# Patient Record
Sex: Male | Born: 1960 | Hispanic: No | Marital: Married | Smoking: Never smoker
Health system: Southern US, Community
[De-identification: ages and names within clinical notes are randomized; demographics above are authoritative.]

## PROBLEM LIST (undated history)

## (undated) DIAGNOSIS — Z87442 Personal history of urinary calculi: Secondary | ICD-10-CM

## (undated) HISTORY — PX: APPENDECTOMY: SHX54

---

## 2017-07-30 ENCOUNTER — Inpatient Hospital Stay
Admission: EM | Admit: 2017-07-30 | Discharge: 2017-08-01 | DRG: 494 | Disposition: A | Discharge status: Home Health | Payer: Worker's Compensation | Attending: Surgery | Admitting: Surgery

## 2017-07-30 ENCOUNTER — Inpatient Hospital Stay: Payer: Worker's Compensation

## 2017-07-30 ENCOUNTER — Inpatient Hospital Stay: Payer: Worker's Compensation | Admitting: Certified Registered Nurse Anesthetist

## 2017-07-30 ENCOUNTER — Other Ambulatory Visit: Payer: Self-pay

## 2017-07-30 ENCOUNTER — Encounter
Admission: EM | Disposition: A | Discharge status: Home Health | Payer: Self-pay | Source: Home / Self Care | Attending: Surgery

## 2017-07-30 ENCOUNTER — Emergency Department: Payer: Worker's Compensation

## 2017-07-30 ENCOUNTER — Encounter: Payer: Self-pay | Admitting: Emergency Medicine

## 2017-07-30 DIAGNOSIS — R509 Fever, unspecified: Secondary | ICD-10-CM | POA: Diagnosis present

## 2017-07-30 DIAGNOSIS — Y9289 Other specified places as the place of occurrence of the external cause: Secondary | ICD-10-CM

## 2017-07-30 DIAGNOSIS — Y990 Civilian activity done for income or pay: Secondary | ICD-10-CM

## 2017-07-30 DIAGNOSIS — W208XXA Other cause of strike by thrown, projected or falling object, initial encounter: Secondary | ICD-10-CM | POA: Diagnosis present

## 2017-07-30 DIAGNOSIS — S8251XA Displaced fracture of medial malleolus of right tibia, initial encounter for closed fracture: Secondary | ICD-10-CM | POA: Diagnosis present

## 2017-07-30 DIAGNOSIS — S82402A Unspecified fracture of shaft of left fibula, initial encounter for closed fracture: Secondary | ICD-10-CM | POA: Diagnosis present

## 2017-07-30 DIAGNOSIS — M545 Low back pain: Secondary | ICD-10-CM | POA: Diagnosis present

## 2017-07-30 DIAGNOSIS — S82202A Unspecified fracture of shaft of left tibia, initial encounter for closed fracture: Secondary | ICD-10-CM | POA: Diagnosis present

## 2017-07-30 DIAGNOSIS — M79662 Pain in left lower leg: Secondary | ICD-10-CM | POA: Diagnosis present

## 2017-07-30 DIAGNOSIS — Z419 Encounter for procedure for purposes other than remedying health state, unspecified: Secondary | ICD-10-CM

## 2017-07-30 HISTORY — PX: ORIF TIBIA FRACTURE: SHX5416

## 2017-07-30 LAB — COMPREHENSIVE METABOLIC PANEL
ALT: 16 U/L — ABNORMAL LOW (ref 17–63)
ANION GAP: 7 (ref 5–15)
AST: 22 U/L (ref 15–41)
Albumin: 4.2 g/dL (ref 3.5–5.0)
Alkaline Phosphatase: 51 U/L (ref 38–126)
BILIRUBIN TOTAL: 0.8 mg/dL (ref 0.3–1.2)
BUN: 15 mg/dL (ref 6–20)
CHLORIDE: 103 mmol/L (ref 101–111)
CO2: 26 mmol/L (ref 22–32)
Calcium: 8.6 mg/dL — ABNORMAL LOW (ref 8.9–10.3)
Creatinine, Ser: 0.75 mg/dL (ref 0.61–1.24)
Glucose, Bld: 122 mg/dL — ABNORMAL HIGH (ref 65–99)
POTASSIUM: 3.7 mmol/L (ref 3.5–5.1)
Sodium: 136 mmol/L (ref 135–145)
TOTAL PROTEIN: 7.2 g/dL (ref 6.5–8.1)

## 2017-07-30 LAB — CBC WITH DIFFERENTIAL/PLATELET
BASOS ABS: 0.1 10*3/uL (ref 0–0.1)
Basophils Relative: 1 %
EOS PCT: 1 %
Eosinophils Absolute: 0.1 10*3/uL (ref 0–0.7)
HCT: 49.1 % (ref 40.0–52.0)
Hemoglobin: 16.8 g/dL (ref 13.0–18.0)
LYMPHS ABS: 1.1 10*3/uL (ref 1.0–3.6)
LYMPHS PCT: 11 %
MCH: 30.8 pg (ref 26.0–34.0)
MCHC: 34.3 g/dL (ref 32.0–36.0)
MCV: 89.9 fL (ref 80.0–100.0)
Monocytes Absolute: 0.4 10*3/uL (ref 0.2–1.0)
Monocytes Relative: 4 %
Neutro Abs: 8.3 10*3/uL — ABNORMAL HIGH (ref 1.4–6.5)
Neutrophils Relative %: 83 %
PLATELETS: 243 10*3/uL (ref 150–440)
RBC: 5.46 MIL/uL (ref 4.40–5.90)
RDW: 13.1 % (ref 11.5–14.5)
WBC: 10 10*3/uL (ref 3.8–10.6)

## 2017-07-30 SURGERY — OPEN REDUCTION INTERNAL FIXATION (ORIF) TIBIA FRACTURE
Anesthesia: Spinal | Site: Leg Lower | Laterality: Bilateral | Wound class: Clean

## 2017-07-30 MED ORDER — BUPIVACAINE IN DEXTROSE 0.75-8.25 % IT SOLN
INTRATHECAL | Status: DC | PRN
  Administered 2017-07-30: 1.8 mL via INTRATHECAL

## 2017-07-30 MED ORDER — FENTANYL CITRATE (PF) 100 MCG/2ML IJ SOLN: 25.0000 ug | INTRAMUSCULAR | Status: DC | PRN

## 2017-07-30 MED ORDER — OXYCODONE HCL 5 MG PO TABS: 5.0000 mg | ORAL_TABLET | ORAL | Status: DC | PRN

## 2017-07-30 MED ORDER — MIDAZOLAM HCL 5 MG/5ML IJ SOLN
INTRAMUSCULAR | Status: DC | PRN
  Administered 2017-07-30: 2 mg via INTRAVENOUS

## 2017-07-30 MED ORDER — MIDAZOLAM HCL 2 MG/2ML IJ SOLN
INTRAMUSCULAR | Status: AC
  Filled 2017-07-30: qty 2

## 2017-07-30 MED ORDER — LACTATED RINGERS IV SOLN
INTRAVENOUS | Status: DC | PRN
  Administered 2017-07-30: 19:00:00 via INTRAVENOUS

## 2017-07-30 MED ORDER — CLINDAMYCIN PHOSPHATE 600 MG/50ML IV SOLN
600.0000 mg | Freq: Four times a day (QID) | INTRAVENOUS | Status: AC
  Administered 2017-07-31 (×3): 600 mg via INTRAVENOUS
  Filled 2017-07-30 (×3): qty 50

## 2017-07-30 MED ORDER — KCL IN DEXTROSE-NACL 20-5-0.9 MEQ/L-%-% IV SOLN: INTRAVENOUS | Status: DC

## 2017-07-30 MED ORDER — FLEET ENEMA 7-19 GM/118ML RE ENEM: 1.0000 | ENEMA | Freq: Once | RECTAL | Status: DC | PRN

## 2017-07-30 MED ORDER — HYDROMORPHONE HCL 1 MG/ML IJ SOLN
1.0000 mg | Freq: Once | INTRAMUSCULAR | Status: AC
  Administered 2017-07-30: 1 mg via INTRAVENOUS
  Filled 2017-07-30: qty 1

## 2017-07-30 MED ORDER — ACETAMINOPHEN 325 MG PO TABS
650.0000 mg | ORAL_TABLET | ORAL | Status: DC | PRN
  Administered 2017-08-01: 650 mg via ORAL
  Filled 2017-07-30: qty 2

## 2017-07-30 MED ORDER — HYDROMORPHONE HCL 1 MG/ML IJ SOLN: 1.0000 mg | INTRAMUSCULAR | Status: DC | PRN

## 2017-07-30 MED ORDER — BUPIVACAINE HCL 0.5 % IJ SOLN
INTRAMUSCULAR | Status: DC | PRN
  Administered 2017-07-30: 20 mL
  Administered 2017-07-30: 10 mL

## 2017-07-30 MED ORDER — KETOROLAC TROMETHAMINE 15 MG/ML IJ SOLN: 30.0000 mg | Freq: Once | INTRAMUSCULAR | Status: DC

## 2017-07-30 MED ORDER — METOCLOPRAMIDE HCL 10 MG PO TABS: 5.0000 mg | ORAL_TABLET | Freq: Three times a day (TID) | ORAL | Status: DC | PRN

## 2017-07-30 MED ORDER — CLINDAMYCIN PHOSPHATE 600 MG/50ML IV SOLN
600.0000 mg | Freq: Once | INTRAVENOUS | Status: DC
  Filled 2017-07-30 (×2): qty 50

## 2017-07-30 MED ORDER — CLINDAMYCIN PHOSPHATE 600 MG/50ML IV SOLN
INTRAVENOUS | Status: DC | PRN
  Administered 2017-07-30: 600 mg via INTRAVENOUS

## 2017-07-30 MED ORDER — HYDROMORPHONE HCL 1 MG/ML IJ SOLN
1.0000 mg | INTRAMUSCULAR | Status: DC | PRN
Start: 2017-07-30 — End: 2017-07-30

## 2017-07-30 MED ORDER — KETOROLAC TROMETHAMINE 30 MG/ML IJ SOLN
INTRAMUSCULAR | Status: AC
  Filled 2017-07-30: qty 1

## 2017-07-30 MED ORDER — PANTOPRAZOLE SODIUM 40 MG IV SOLR: 40.0000 mg | Freq: Every day | INTRAVENOUS | Status: DC

## 2017-07-30 MED ORDER — ACETAMINOPHEN 500 MG PO TABS
1000.0000 mg | ORAL_TABLET | Freq: Four times a day (QID) | ORAL | Status: AC
  Administered 2017-07-31 (×4): 1000 mg via ORAL
  Filled 2017-07-30 (×4): qty 2

## 2017-07-30 MED ORDER — BISACODYL 10 MG RE SUPP: 10.0000 mg | Freq: Every day | RECTAL | Status: DC | PRN

## 2017-07-30 MED ORDER — PROPOFOL 500 MG/50ML IV EMUL
INTRAVENOUS | Status: AC
  Filled 2017-07-30: qty 50

## 2017-07-30 MED ORDER — FENTANYL CITRATE (PF) 100 MCG/2ML IJ SOLN
INTRAMUSCULAR | Status: DC | PRN
  Administered 2017-07-30 (×2): 50 ug via INTRAVENOUS

## 2017-07-30 MED ORDER — ACETAMINOPHEN 325 MG PO TABS: 650.0000 mg | ORAL_TABLET | Freq: Four times a day (QID) | ORAL | Status: DC | PRN

## 2017-07-30 MED ORDER — MAGNESIUM HYDROXIDE 400 MG/5ML PO SUSP
30.0000 mL | Freq: Every day | ORAL | Status: DC | PRN
  Administered 2017-07-31: 30 mL via ORAL
  Filled 2017-07-30: qty 30

## 2017-07-30 MED ORDER — KCL IN DEXTROSE-NACL 20-5-0.9 MEQ/L-%-% IV SOLN
INTRAVENOUS | Status: DC
  Administered 2017-07-31 (×3): via INTRAVENOUS
  Filled 2017-07-30 (×6): qty 1000

## 2017-07-30 MED ORDER — PROPOFOL 10 MG/ML IV BOLUS
INTRAVENOUS | Status: AC
  Filled 2017-07-30: qty 20

## 2017-07-30 MED ORDER — BUPIVACAINE HCL (PF) 0.5 % IJ SOLN
INTRAMUSCULAR | Status: AC
  Filled 2017-07-30: qty 30

## 2017-07-30 MED ORDER — DOCUSATE SODIUM 100 MG PO CAPS: 100.0000 mg | ORAL_CAPSULE | Freq: Two times a day (BID) | ORAL | Status: DC

## 2017-07-30 MED ORDER — ENOXAPARIN SODIUM 40 MG/0.4ML ~~LOC~~ SOLN
40.0000 mg | SUBCUTANEOUS | Status: DC
  Administered 2017-07-31 – 2017-08-01 (×2): 40 mg via SUBCUTANEOUS
  Filled 2017-07-30 (×2): qty 0.4

## 2017-07-30 MED ORDER — DOCUSATE SODIUM 100 MG PO CAPS
100.0000 mg | ORAL_CAPSULE | Freq: Two times a day (BID) | ORAL | Status: DC
  Administered 2017-07-31 – 2017-08-01 (×3): 100 mg via ORAL
  Filled 2017-07-30 (×3): qty 1

## 2017-07-30 MED ORDER — DIPHENHYDRAMINE HCL 12.5 MG/5ML PO ELIX: 12.5000 mg | ORAL_SOLUTION | ORAL | Status: DC | PRN

## 2017-07-30 MED ORDER — ACETAMINOPHEN 650 MG RE SUPP: 650.0000 mg | RECTAL | Status: DC | PRN

## 2017-07-30 MED ORDER — SODIUM CHLORIDE 0.9 % IV SOLN
INTRAVENOUS | Status: DC | PRN
  Administered 2017-07-30: 25 ug/min via INTRAVENOUS

## 2017-07-30 MED ORDER — FENTANYL CITRATE (PF) 100 MCG/2ML IJ SOLN
INTRAMUSCULAR | Status: AC
  Filled 2017-07-30: qty 2

## 2017-07-30 MED ORDER — NEOMYCIN-POLYMYXIN B GU 40-200000 IR SOLN
Status: DC | PRN
  Administered 2017-07-30 (×2): 4 mL

## 2017-07-30 MED ORDER — MAGNESIUM HYDROXIDE 400 MG/5ML PO SUSP: 30.0000 mL | Freq: Every day | ORAL | Status: DC | PRN

## 2017-07-30 MED ORDER — ONDANSETRON HCL 4 MG/2ML IJ SOLN: 4.0000 mg | Freq: Once | INTRAMUSCULAR | Status: DC | PRN

## 2017-07-30 MED ORDER — TETRACAINE HCL 1 % IJ SOLN
INTRAMUSCULAR | Status: DC | PRN
  Administered 2017-07-30: 5 mg via INTRASPINAL

## 2017-07-30 MED ORDER — ACETAMINOPHEN 650 MG RE SUPP: 650.0000 mg | Freq: Four times a day (QID) | RECTAL | Status: DC | PRN

## 2017-07-30 MED ORDER — PROPOFOL 500 MG/50ML IV EMUL
INTRAVENOUS | Status: DC | PRN
  Administered 2017-07-30: 60 ug/kg/min via INTRAVENOUS

## 2017-07-30 MED ORDER — LIDOCAINE HCL (PF) 2 % IJ SOLN
INTRAMUSCULAR | Status: AC
  Filled 2017-07-30: qty 10

## 2017-07-30 MED ORDER — KETOROLAC TROMETHAMINE 15 MG/ML IJ SOLN
15.0000 mg | Freq: Four times a day (QID) | INTRAMUSCULAR | Status: AC
  Administered 2017-07-31 (×4): 15 mg via INTRAVENOUS
  Filled 2017-07-30 (×4): qty 1

## 2017-07-30 MED ORDER — ONDANSETRON HCL 4 MG/2ML IJ SOLN
INTRAMUSCULAR | Status: AC
  Filled 2017-07-30: qty 2

## 2017-07-30 MED ORDER — PANTOPRAZOLE SODIUM 40 MG PO TBEC
40.0000 mg | DELAYED_RELEASE_TABLET | Freq: Every day | ORAL | Status: DC
  Administered 2017-07-31 – 2017-08-01 (×2): 40 mg via ORAL
  Filled 2017-07-30 (×2): qty 1

## 2017-07-30 MED ORDER — ONDANSETRON HCL 4 MG PO TABS: 4.0000 mg | ORAL_TABLET | Freq: Four times a day (QID) | ORAL | Status: DC | PRN

## 2017-07-30 MED ORDER — GLYCOPYRROLATE 0.2 MG/ML IJ SOLN
INTRAMUSCULAR | Status: AC
  Filled 2017-07-30: qty 1

## 2017-07-30 MED ORDER — METOCLOPRAMIDE HCL 5 MG/ML IJ SOLN: 5.0000 mg | Freq: Three times a day (TID) | INTRAMUSCULAR | Status: DC | PRN

## 2017-07-30 MED ORDER — OXYCODONE HCL 5 MG PO TABS
5.0000 mg | ORAL_TABLET | ORAL | Status: DC | PRN
Start: 2017-07-30 — End: 2017-08-01
  Administered 2017-07-31 – 2017-08-01 (×3): 5 mg via ORAL
  Filled 2017-07-30 (×4): qty 1

## 2017-07-30 MED ORDER — DEXAMETHASONE SODIUM PHOSPHATE 10 MG/ML IJ SOLN
INTRAMUSCULAR | Status: AC
  Filled 2017-07-30: qty 1

## 2017-07-30 MED ORDER — ONDANSETRON HCL 4 MG/2ML IJ SOLN: 4.0000 mg | Freq: Four times a day (QID) | INTRAMUSCULAR | Status: DC | PRN

## 2017-07-30 MED ORDER — OXYCODONE HCL 5 MG PO TABS
10.0000 mg | ORAL_TABLET | ORAL | Status: DC | PRN
  Administered 2017-08-01: 10 mg via ORAL
  Filled 2017-07-30: qty 2

## 2017-07-30 SURGICAL SUPPLY — 59 items
BANDAGE ACE 4X5 VEL STRL LF (GAUZE/BANDAGES/DRESSINGS) ×6 IMPLANT
BANDAGE ACE 6X5 VEL STRL LF (GAUZE/BANDAGES/DRESSINGS) ×6 IMPLANT
BANDAGE ELASTIC 6 LF NS (GAUZE/BANDAGES/DRESSINGS) IMPLANT
BIT DRILL 2.5X2.75 QC CALB (BIT) ×3 IMPLANT
BIT DRILL 2.9 CANN QC NONSTRL (BIT) ×3 IMPLANT
BLADE SURG SZ10 CARB STEEL (BLADE) ×12 IMPLANT
BNDG COHESIVE 4X5 TAN STRL (GAUZE/BANDAGES/DRESSINGS) ×6 IMPLANT
BNDG ESMARK 4X12 TAN STRL LF (GAUZE/BANDAGES/DRESSINGS) ×6 IMPLANT
BNDG PLASTER FAST 4X5 WHT LF (CAST SUPPLIES) IMPLANT
BNDG PLASTER FAST 6X5 WHT LF (CAST SUPPLIES) IMPLANT
CANISTER SUCT 1200ML W/VALVE (MISCELLANEOUS) ×3 IMPLANT
CHLORAPREP W/TINT 26ML (MISCELLANEOUS) ×12 IMPLANT
DECANTER SPIKE VIAL GLASS SM (MISCELLANEOUS) ×3 IMPLANT
DRAPE C-ARM XRAY 36X54 (DRAPES) ×3 IMPLANT
DRAPE C-ARMOR (DRAPES) ×3 IMPLANT
DRAPE IMP U-DRAPE 54X76 (DRAPES) ×3 IMPLANT
DRAPE INCISE IOBAN 66X45 STRL (DRAPES) IMPLANT
ELECT REM PT RETURN 9FT ADLT (ELECTROSURGICAL) ×3
ELECTRODE REM PT RTRN 9FT ADLT (ELECTROSURGICAL) ×1 IMPLANT
GAUZE SPONGE 4X4 12PLY STRL (GAUZE/BANDAGES/DRESSINGS) ×6 IMPLANT
GAUZE XEROFORM 4X4 STRL (GAUZE/BANDAGES/DRESSINGS) ×3 IMPLANT
GLOVE BIO SURGEON STRL SZ7.5 (GLOVE) ×24 IMPLANT
GLOVE INDICATOR 8.0 STRL GRN (GLOVE) ×12 IMPLANT
GOWN STRL REUS W/ TWL LRG LVL3 (GOWN DISPOSABLE) ×2 IMPLANT
GOWN STRL REUS W/ TWL XL LVL3 (GOWN DISPOSABLE) ×2 IMPLANT
GOWN STRL REUS W/TWL LRG LVL3 (GOWN DISPOSABLE) ×4
GOWN STRL REUS W/TWL XL LVL3 (GOWN DISPOSABLE) ×4
HANDLE YANKAUER SUCT BULB TIP (MISCELLANEOUS) ×3 IMPLANT
K-WIRE ACE 1.6X6 (WIRE) ×9
KWIRE ACE 1.6X6 (WIRE) ×3 IMPLANT
NS IRRIG 1000ML POUR BTL (IV SOLUTION) ×6 IMPLANT
PACK EXTREMITY ARMC (MISCELLANEOUS) ×3 IMPLANT
PAD CAST CTTN 4X4 STRL (SOFTGOODS) ×3 IMPLANT
PADDING CAST COTTON 4X4 STRL (SOFTGOODS) ×6
PENCIL ELECTRO HAND CTR (MISCELLANEOUS) ×3 IMPLANT
PLATE 9H 184 LT MED DIST TIB (Plate) ×3 IMPLANT
SCREW ACE CAN 4.0 40M (Screw) ×6 IMPLANT
SCREW CORTICAL 3.5MM 22MM (Screw) ×3 IMPLANT
SCREW CORTICAL 3.5MM 24MM (Screw) ×6 IMPLANT
SCREW LOCK CORT STAR 3.5X30 (Screw) ×3 IMPLANT
SCREW LOCK CORT STAR 3.5X40 (Screw) ×3 IMPLANT
SCREW LOCK CORT STAR 3.5X44 (Screw) ×3 IMPLANT
SCREW LOCK CORT STAR 3.5X46 (Screw) ×3 IMPLANT
SCREW LP 3.5 (Screw) ×3 IMPLANT
SHEET MEDIUM DRAPE 40X70 STRL (DRAPES) ×3 IMPLANT
SHEET NEURO XL SOL CTL (MISCELLANEOUS) ×3 IMPLANT
SPLINT CAST 1 STEP 4X30 (MISCELLANEOUS) ×6 IMPLANT
SPONGE LAP 18X18 5 PK (GAUZE/BANDAGES/DRESSINGS) ×3 IMPLANT
STAPLER SKIN PROX 35W (STAPLE) IMPLANT
STOCKINETTE IMPERV 14X48 (MISCELLANEOUS) ×3 IMPLANT
STOCKINETTE M/LG 89821 (MISCELLANEOUS) ×6 IMPLANT
SUCTION FRAZIER HANDLE 10FR (MISCELLANEOUS) ×4
SUCTION TUBE FRAZIER 10FR DISP (MISCELLANEOUS) ×2 IMPLANT
SUT PROLENE 4 0 PS 2 18 (SUTURE) ×3 IMPLANT
SUT VIC AB 0 CT1 36 (SUTURE) ×9 IMPLANT
SUT VIC AB 1 CT1 36 (SUTURE) ×3 IMPLANT
TOWEL OR 17X26 4PK STRL BLUE (TOWEL DISPOSABLE) IMPLANT
TUBING CONNECTING 10 (TUBING) ×2 IMPLANT
TUBING CONNECTING 10' (TUBING) ×1

## 2017-07-30 NOTE — Anesthesia Procedure Notes (Signed)
Spinal  Patient location during procedure: OR Staffing Anesthesiologist: Zareth Rippetoe, MD Performed: anesthesiologist  Preanesthetic Checklist Completed: patient identified, site marked, surgical consent, pre-op evaluation, timeout performed, IV checked and risks and benefits discussed Spinal Block Patient position: sitting Prep: ChloraPrep Patient monitoring: heart rate, cardiac monitor, continuous pulse ox and blood pressure Approach: midline Location: L3-4 Injection technique: single-shot Needle Needle type: Pencil-Tip  Needle gauge: 25 G Needle length: 9 cm Assessment Sensory level: T10     

## 2017-07-30 NOTE — ED Notes (Signed)
Surgery staff here to transport patient.
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
## 2017-07-30 NOTE — Op Note (Signed)
07/30/2017  9:22 PM  Patient:   Roger Wheeler  Pre-Op Diagnosis:   1.  Closed displaced distal tibial shaft fracture with proximal fibular shaft fracture, left lower leg. 2.  Closed displaced medial malleolar fracture, right ankle.Marland Kitchen.  Post-Op Diagnosis:   Same.  Procedure:   1.  Open reduction and internal fixation of left distal tibial shaft fracture. 2.  Open reduction and internal fixation of medial malleolar fracture, right ankle..  Surgeon:   Maryagnes AmosJ. Jeffrey Serah Nicoletti, MD  Assistant:   None  Anesthesia:   Spinal  Findings:   As above.  Complications:   None  EBL:   50 cc  Fluids:   900 cc crystalloid  UOP:   None  TT:   61 min at 300 mmHg on left lower extremity, 31 minutes at 300mmHg on right lower extremity.  Drains:   None  Closure:   Staples  Implants:   Biomet ALPS 7-hole distal tibial locking plate and screws for left lower extremity fixation, 40 mm partially threaded cancellus screws x2 for right ankle fixation.  Brief Clinical Note:   The patient is a 57 year old male who sustained the above-noted injury earlier this morning while unloading a 1000 pound pallet at work. When the safety cord was released, the load shifted and fell off onto his legs. He was brought to the emergency room where x-rays demonstrated the above-noted injuries. The patient presents at this time for definitive management of his injuries.  Procedure:   The patient was brought into the operating room. After adequate spinal anesthesia was obtained, the patient was lain in the supine position. The left foot and lower extremity were prepped with ChloraPrep solution, then draped sterilely. Preoperative antibiotics were administered. A timeout was performed to verify the appropriate surgical site before the limb was exsanguinated with an Esmarch and the thigh tourniquet inflated to 300 mmHg. A 5-6 cm incision was made over the medial aspect of the distal tibia and medial malleolus. The incision was carried down  through the subcutaneous tissues to expose the medial aspect of the distal tibia with care taken to avoid the saphenous vein and adjacent nerves.  A 7-hole plate was held adjacent to the leg and the adequacy of its length verified using fluoroscopic imaging.  The plate was then slid up subcutaneously along the periosteum of the antero-medial aspect of the tibia. After verifying its position fluoroscopically, it was secured using a K wire at the proximal end of the plate. Again the plate's position was adjusted slightly based on AP and lateral projections before it was secured using 1 cortical screw and 4 locking screws distally, and 3 bicortical nonlocking screws proximally. The proximal screws were placed through short stab incisions. The adequacy of fracture reduction and hardware position was verified fluoroscopically in AP and lateral projections and found to be excellent.  The wounds were copiously irrigated with sterile saline before the subcutaneous tissues were closed using 2-0 Vicryl interrupted sutures. The skin was closed using staples.  A total of 20 cc of 0.5% plain Sensorcaine was injected in and around the incision sites to help with postoperative analgesia before sterile bulky dressings were applied to the wounds.  Attention was directed to the right lower extremity and ankle. The right foot and lower extremity were prepped with ChloraPrep solution, then draped sterilely. Preoperative antibiotics were administered. A timeout was performed to verify the appropriate surgical site before the limb was exsanguinated with an Esmarch and the thigh tourniquet inflated to 300 mmHg.  An approximately 3-4 cm longitudinal incision was made over the medial and distal portions of the medial malleolus. This incision was carried down through the subcutaneous tissues to expose the fracture site. Care was taken to identify and protect the saphenous nerve and vein. The fracture hematoma was removed and the ankle  joint irrigated thoroughly with sterile saline solution. The fracture was reduced and temporarily secured using a bone-holding clamp. The medial malleolar fragment was noted to be quite comminuted. Two guidewires were placed obliquely across the fracture from distal to proximal into the distal tibial metaphysis. After verifying their positions fluoroscopically, each guidewire was sequentially over-reamed and replaced with a 40 mm partially threaded 4.0 cancellous screw in lag fashion. Again the adequacy of fracture reduction, hardware position, and mortise restoration was verified in AP, lateral, and oblique projections and found to be excellent.  This wound was copiously irrigated with sterile saline solution for the subcutaneous tissues were closed using 2-0 Vicryl interrupted sutures. Again the skin was closed using staples. A total of 10 cc of 0.5% plain Sensorcaine was injected in and around the incision site to help with postoperative analgesia. A sterile bulky dressing was applied to the wound before the patient was placed into a long cam walker boot.  Attention was redirected to the left lower extremity.  Additional padding was applied to the foot and lower leg before a posterior splint with a sugar tong supplement was applied, maintaining the ankle in neutral dorsiflexion. The patient was then awakened and returned to the recovery room in satisfactory condition after tolerating the procedure well.

## 2017-07-30 NOTE — H&P (Addendum)
Subjective:  Chief complaint: Bilateral lower leg pain.  The patient is a 57 y.o. male in otherwise excellent health who sustained injuries to both lower extremities earlier today while at work. Apparently, he was helping to unload a 1000 pound pallet when its contents slipped off and struck him across both legs and his back. He was brought to the emergency room where x-rays demonstrated fractures to both the left tibia and fibula and the right medial malleolus. The patient denies any loss of consciousness associated with the injury, and denies any light-headedness, dizziness, chest pain, or shortness of breath which might have contributed to the injury.  There are no active problems to display for this patient.  History reviewed. No pertinent past medical history.  Past Surgical History:  Procedure Laterality Date  . APPENDECTOMY       (Not in a hospital admission) Allergies  Allergen Reactions  . Penicillins     Has patient had a PCN reaction causing immediate rash, facial/tongue/throat swelling, SOB or lightheadedness with hypotension: Unknown Has patient had a PCN reaction causing severe rash involving mucus membranes or skin necrosis: Unknown Has patient had a PCN reaction that required hospitalization: Unknown Has patient had a PCN reaction occurring within the last 10 years: Unknown If all of the above answers are "NO", then may proceed with Cephalosporin use.    Social History   Tobacco Use  . Smoking status: Never Smoker  . Smokeless tobacco: Never Used  Substance Use Topics  . Alcohol use: No    Frequency: Never    No family history on file.   Review of Systems: As noted above. The patient denies any chest pain, shortness of breath, nausea, vomiting, diarrhea, constipation, belly pain, blood in his/her stool, or burning with urination.  Objective: Temp:  [98.2 F (36.8 C)] 98.2 F (36.8 C) (02/18 0916) Pulse Rate:  [71] 71 (02/18 0916) Resp:  [16] 16 (02/18  0916) BP: (139)/(95) 139/95 (02/18 0916) SpO2:  [97 %] 97 % (02/18 0916)  Physical Exam: General:  Alert, no acute distress Psychiatric:  Patient is competent for consent with normal mood and affect  Cardiovascular:  RRR  Respiratory:  Clear to auscultation. No wheezing. Non-labored breathing GI:  Abdomen is soft and non-tender Skin:  No lesions in the area of chief complaint Neurologic:  Sensation intact distally Lymphatic:  No axillary or cervical lymphadenopathy  Orthopedic Exam:  Orthopedic examination of the right lower extremity and foot demonstrates the foot to be in a posterior splint.  Skin inspection at the proximal distal margins of the splint show no abnormalities.  He has moderate tenderness to palpation over the medial more so than the lateral aspects of the ankle.  The patient is able to dorsiflex and plantarflex his toes.  Sensation is intact to all digits.  He has excellent capillary refill to all digits.  Orthopedic examination of the left lower extremity and foot demonstrates the foot to be in a posterior splint.  Skin inspection at the left proximal and distal margins of the splint show no skin abnormalities.  He has moderate tenderness to palpation over the distal portion of the tibia as well as over the proximal portion of the fibula.  He is able to dorsiflex and plantarflex his toes.  Sensation is intact to all digits, as well as to the dorsal and plantar aspects of his foot.  He has excellent capillary refill to all digits.  Imaging Review: Recent x-rays of the right lower leg are  available for review.  These films demonstrate an essentially nondisplaced medial malleolar fracture.  The mortise appears to be intact as does the syndesmosis.  There does not appear to be any fibular fracture, even more proximally.  Recent x-rays of the left lower leg are available for review.  These films demonstrate a displaced tib-fib fracture with the tibial fracture involving the distal  third of the tibia with several fracture lines extending into the plafond.  The fibula has been fractured approximately.  No lytic lesions or significant degenerative changes are identified.  Assessment: 1.  Closed displaced left distal tibial fracture with more proximal fibular fracture. 2.  Closed nondisplaced right medial malleolar fracture.  Plan: The treatment options, including both surgical and nonsurgical choices, have been discussed in detail with the patient and his family. The patient would like to proceed with surgical intervention to include an open reduction and internal fixation of his left tibial fracture, as well as of the right medial malleolar fracture. Risks (including bleeding, infection, nerve and/or blood vessel injury, persistent or recurrent pain, loosening or failure of the components, leg length inequality, malunion and/or nonunion, blood clots, strokes, heart attacks or arrhythmias, pneumonia, etc.) and benefits of the surgical procedures also have been discussed. The patient states his understanding and agrees to proceed. A formal written consent has been obtained.

## 2017-07-30 NOTE — ED Notes (Signed)
Report to surgery. Cleocin hung on bed IV pole for OR use.

## 2017-07-30 NOTE — ED Notes (Signed)
Resting in bed. Cont NPO. Cont toes pink with CFT 2 sec R foot, 3 sec L foot. Await admission bed or ready for surgery.

## 2017-07-30 NOTE — Anesthesia Preprocedure Evaluation (Signed)
Anesthesia Evaluation  Patient identified by MRN, date of birth, ID band Patient awake    Reviewed: Allergy & Precautions, NPO status , Patient's Chart, lab work & pertinent test results, reviewed documented beta blocker date and time   Airway Mallampati: II  TM Distance: >3 FB     Dental  (+) Chipped   Pulmonary           Cardiovascular      Neuro/Psych    GI/Hepatic   Endo/Other    Renal/GU      Musculoskeletal   Abdominal   Peds  Hematology   Anesthesia Other Findings   Reproductive/Obstetrics                             Anesthesia Physical Anesthesia Plan  ASA: II  Anesthesia Plan: Spinal   Post-op Pain Management:    Induction:   PONV Risk Score and Plan:   Airway Management Planned:   Additional Equipment:   Intra-op Plan:   Post-operative Plan:   Informed Consent: I have reviewed the patients History and Physical, chart, labs and discussed the procedure including the risks, benefits and alternatives for the proposed anesthesia with the patient or authorized representative who has indicated his/her understanding and acceptance.     Plan Discussed with: CRNA  Anesthesia Plan Comments:         Anesthesia Quick Evaluation  

## 2017-07-30 NOTE — ED Notes (Signed)
Family at bedside. 

## 2017-07-30 NOTE — Transfer of Care (Signed)
Immediate Anesthesia Transfer of Care Note  Patient: Roger Wheeler  Procedure(s) Performed: LEFT OPEN REDUCTION INTERNAL FIXATION (ORIF) DISTAL TIBIA FRACTURE RIGHT OPEN INTERNAL FIXATION OF RIGHT MALLEOLUS FRACTURE (Bilateral Leg Lower)  Patient Location: PACU  Anesthesia Type:Spinal  Level of Consciousness: awake, alert , oriented and patient cooperative  Airway & Oxygen Therapy: Patient Spontanous Breathing and Patient connected to nasal cannula oxygen  Post-op Assessment: Report given to RN and Post -op Vital signs reviewed and stable  Post vital signs: stable  Last Vitals:  Vitals:   07/30/17 1729 07/30/17 2116  BP: 111/87 94/71  Pulse: 79 83  Resp: 18 12  Temp:  36.6 C  SpO2: 96% 96%    Last Pain:  Vitals:   07/30/17 1729  TempSrc:   PainSc: 3          Complications: No apparent anesthesia complications

## 2017-07-30 NOTE — ED Triage Notes (Addendum)
Pt to ED via EMS from work at [employer] [place], pt was removing a  1000lbs pallet when it fell onto his bilateral lower extremities. Pt denies any LOC. Bilat swelling and deformites noted, pt had + pulses . VS stable . Pt is workers Occupational hygienistcomp. MD at bedside

## 2017-07-30 NOTE — Anesthesia Post-op Follow-up Note (Signed)
Anesthesia QCDR form completed.        

## 2017-07-30 NOTE — ED Notes (Signed)
Interpreter at bedside for use

## 2017-07-31 ENCOUNTER — Encounter: Payer: Self-pay | Admitting: Surgery

## 2017-07-31 LAB — CBC WITH DIFFERENTIAL/PLATELET
BASOS ABS: 0 10*3/uL (ref 0–0.1)
Basophils Relative: 0 %
EOS ABS: 0 10*3/uL (ref 0–0.7)
EOS PCT: 0 %
HCT: 40.5 % (ref 40.0–52.0)
Hemoglobin: 13.8 g/dL (ref 13.0–18.0)
LYMPHS PCT: 12 %
Lymphs Abs: 1.2 10*3/uL (ref 1.0–3.6)
MCH: 30.7 pg (ref 26.0–34.0)
MCHC: 34 g/dL (ref 32.0–36.0)
MCV: 90.3 fL (ref 80.0–100.0)
Monocytes Absolute: 0.9 10*3/uL (ref 0.2–1.0)
Monocytes Relative: 9 %
Neutro Abs: 8.1 10*3/uL — ABNORMAL HIGH (ref 1.4–6.5)
Neutrophils Relative %: 79 %
PLATELETS: 214 10*3/uL (ref 150–440)
RBC: 4.48 MIL/uL (ref 4.40–5.90)
RDW: 12.9 % (ref 11.5–14.5)
WBC: 10.2 10*3/uL (ref 3.8–10.6)

## 2017-07-31 LAB — BASIC METABOLIC PANEL
Anion gap: 8 (ref 5–15)
BUN: 16 mg/dL (ref 6–20)
CO2: 25 mmol/L (ref 22–32)
CREATININE: 0.69 mg/dL (ref 0.61–1.24)
Calcium: 7.8 mg/dL — ABNORMAL LOW (ref 8.9–10.3)
Chloride: 103 mmol/L (ref 101–111)
GFR calc Af Amer: >60 mL/min (ref >60)
Glucose, Bld: 124 mg/dL — ABNORMAL HIGH (ref 65–99)
POTASSIUM: 3.5 mmol/L (ref 3.5–5.1)
Sodium: 136 mmol/L (ref 135–145)

## 2017-07-31 MED ORDER — OXYCODONE HCL 5 MG PO TABS: 5.0000 mg | ORAL_TABLET | ORAL | 0 refills | Status: DC | PRN

## 2017-07-31 MED ORDER — SODIUM CHLORIDE 0.9 % IV SOLN
Freq: Once | INTRAVENOUS | Status: AC
  Administered 2017-07-31: 03:00:00 via INTRAVENOUS

## 2017-07-31 MED ORDER — ASPIRIN EC 325 MG PO TBEC: 325.0000 mg | DELAYED_RELEASE_TABLET | Freq: Every day | ORAL | 1 refills | Status: DC

## 2017-07-31 NOTE — Progress Notes (Signed)
Patient BP was running soft. Dr. Jeanene Erballed new orders given, will continue to monitor.

## 2017-07-31 NOTE — Anesthesia Postprocedure Evaluation (Signed)
Anesthesia Post Note  Patient: Tyjuan Blanford  Procedure(s) Performed: LEFT OPEN REDUCTION INTERNAL FIXATION (ORIF) DISTAL TIBIA FRACTURE RIGHT OPEN INTERNAL FIXATION OF RIGHT MALLEOLUS FRACTURE (Bilateral Leg Lower)  Patient location during evaluation: PACU Anesthesia Type: Spinal Level of consciousness: oriented and awake and alert Pain management: pain level controlled Vital Signs Assessment: post-procedure vital signs reviewed and stable Respiratory status: spontaneous breathing, respiratory function stable and patient connected to nasal cannula oxygen Cardiovascular status: blood pressure returned to baseline and stable Postop Assessment: no headache, no backache and no apparent nausea or vomiting Anesthetic complications: no     Last Vitals:  Vitals:   07/31/17 0032 07/31/17 0133  BP: 105/74 93/64  Pulse: 74 75  Resp: 19 18  Temp: 36.6 C 36.6 C  SpO2: 98% 98%    Last Pain:  Vitals:   07/31/17 0133  TempSrc: Oral  PainSc:                  Gwen Sarvis S

## 2017-07-31 NOTE — Clinical Social Work Note (Addendum)
Clinical Social Work Assessment  Patient Details  Name: Roger Wheeler MRN: [id number] Date of Birth: 04-20-1961  Date of referral:  07/31/17               Reason for consult:  Facility Placement                Permission sought to share information with:    Permission granted to share information::     Name::      [place]::   [place]  Relationship::     Contact Information:     Housing/Transportation Living arrangements for the past 2 months:  [place] of Information:  Patient, Spouse Patient Interpreter Needed:  None Criminal Activity/Legal Involvement Pertinent to Current Situation/Hospitalization:  No - Comment as needed Significant Relationships:  Spouse Lives with:  Spouse Do you feel safe going back to the place where you live?  Yes Need for family participation in patient care:  Yes (Comment)  Care giving concerns: Patient lives in [place] with wife Roger Wheeler [phone]).   Social Worker assessment / plan: Holiday representative (CSW) received SNF consult. PT is recommending SNF. Social work Theatre manager met with patient and wife Roger Wheeler at bedside. Patient was laying in bed alert and oriented x4. Social work Theatre manager introduced self and explained the role of the [place]. Patient shared he lives in [place] with wife Roger Wheeler. Per Roger Wheeler, patient's son Roger Wheeler [phone]) is also a primary contact because he understands english better than patient and her. Patient also stated he has [employer]. Social work Theatre manager explained that PT is recommending SNF and that with Workers Compensation it could be difficult to find placement in a SNF because worker's comp may not pay for SNF. Patient and wife verbalized his understanding and prefers to go home. Social work Theatre manager attempted to contact son Roger Wheeler to make him aware of above and a vociemail was left. CSW and Social work Theatre manager will continue to follow up and assist.   Employment status:  Part-Time Insurance information:  Other (Comment Required)(Worker's Comp) PT Recommendations:  [place] / Referral to community resources:  [place]  Patient/Family's Response to care:  Patient refused SNF and prefers to go home.  Patient/Family's Understanding of and Emotional Response to Diagnosis, Current Treatment, and Prognosis: Patient and his wife were both pleasant and thanked social work Theatre manager for assistance.  Emotional Assessment Appearance:  Appears stated age Attitude/Demeanor/Rapport:    Affect (typically observed):  Accepting, Calm, Pleasant Orientation:  Oriented to Self, Oriented to Place, Oriented to  Time, Oriented to Situation Alcohol / Substance use:  Not Applicable Psych involvement (Current and /or in the community):  No (Comment)  Discharge Needs  Concerns to be addressed:  Care Coordination, Discharge Planning Concerns Readmission within the last 30 days:  No Current discharge risk:  Dependent with Mobility Barriers to Discharge:  Continued Medical Work up   Smith Mince, Student-Social Work 07/31/2017, 12:02 PM

## 2017-07-31 NOTE — Care Management (Signed)
[facility] will not be able to meet this patient's needs. I have reached out to Iona HansenJeff Shoup PT [facility] [phone] fax [phone] address [address] [place] [id number] and Trey PaulaJeff has accepted patient for OPPT.  Directions provided to patient and family. OPPT orders requested from MD.

## 2017-07-31 NOTE — Evaluation (Signed)
Physical Therapy Evaluation Patient Details Name: Roger Wheeler MRN: [id number] DOB: 09-14-1960 Today's Date: 07/31/2017   History of Present Illness  Pt admitted to hospital s/p ORIF elective surgery for a L tibia/fibula fracture and displaced fracture of medial malleolus of right tibia, initial encounter for closed fracture.  No PMH on file.   Clinical Impression  Pt is a 57 year old male who lives in an apartment with his wife.  Pt is otherwise healthy and presented with WNL strength during MMT and was able to manage a RW for transfers and ambulation for brief intervals of time only.  Pt required 2 person assistance to maintain balance and WB status during STS with RW and PT provided VC's for hand placement as pt is not familiar with use of RW.  Pt was aware of PWB status of R LE and NWB status of L LE.  Pt was able to ambulate 15 ft with RW with two person assist for maintenance of WB status and VC's for sequencing during turns and backing to bedside.  Pt reported mild increase in pain of R LE and UE fatigue following ambulation.  Pt will benefit from skilled PT with focus on LE strength, proper use of AD, tolerance to activity and functional mobility.  Recommendation of DC location may change to home health pending pt demonstration of stair negotiation during later treatments.    Follow Up Recommendations SNF    Equipment Recommendations  Rolling walker with 5" wheels(Pt will benefit from a knee scooter initially to maintain PWB status on R LE and RW for following DC of WB status.)    Recommendations for Other Services       Precautions / Restrictions Precautions Precautions: Fall Restrictions Weight Bearing Restrictions: Yes RLE Weight Bearing: Weight bearing as tolerated LLE Weight Bearing: Non weight bearing      Mobility  Bed Mobility Overal bed mobility: Needs Assistance Bed Mobility: Supine to Sit     Supine to sit: Min assist     General bed mobility comments: Pt requires  assistance to bring BLE's over EOB during supine to sit and sit to supine.  Transfers Overall transfer level: Needs assistance Equipment used: Rolling walker (2 wheeled) Transfers: Sit to/from Stand Sit to Stand: Min assist         General transfer comment: Pt attempted crutches but was unable to achieve an upright posture due to WB status.  Pt required 2 person assist to initiate STS and orient himself to standing upright.  PT provided VC's for hand placement as pt had never used RW prior to this.  He was able to follow commands on first attempt.  Ambulation/Gait Ambulation/Gait assistance: Min guard Ambulation Distance (Feet): 20 Feet Assistive device: Rolling walker (2 wheeled)     Gait velocity interpretation: Below normal speed for age/gender General Gait Details: Pt able to maintain NWB status on L LE and negotiate RW during turns with min VC's for sequencing.  He reported mild increase in pain of R LE following ambulation.  Stairs            Wheelchair Mobility    Modified Rankin (Stroke Patients Only)       Balance Overall balance assessment: Needs assistance Sitting-balance support: Feet unsupported;Single extremity supported       Standing balance support: Bilateral upper extremity supported  Pertinent Vitals/Pain Pain Assessment: 0-10 Pain Score: 5     Home Living Family/patient expects to be discharged to:: Private residence Living Arrangements: Spouse/significant other Available Help at Discharge: Family Type of Home: Apartment Home Access: Stairs to enter Entrance Stairs-Rails: Left Entrance Stairs-Number of Steps: 15 Home Layout: One level        Prior Function Level of Independence: Independent               Hand Dominance   Dominant Hand: Right    Extremity/Trunk Assessment   Upper Extremity Assessment Upper Extremity Assessment: Overall WFL for tasks assessed    Lower  Extremity Assessment Lower Extremity Assessment: Overall WFL for tasks assessed    Cervical / Trunk Assessment Cervical / Trunk Assessment: Normal  Communication   Communication: No difficulties  Cognition Arousal/Alertness: Awake/alert Behavior During Therapy: WFL for tasks assessed/performed Overall Cognitive Status: Within Functional Limits for tasks assessed                                        General Comments      Exercises     Assessment/Plan    PT Assessment Patient needs continued PT services  PT Problem List Decreased strength;Decreased activity tolerance;Decreased balance;Decreased mobility;Decreased knowledge of use of DME;Pain;Decreased knowledge of precautions       PT Treatment Interventions DME instruction;Therapeutic activities;Gait training;Therapeutic exercise;Stair training;Functional mobility training;Balance training;Patient/family education    PT Goals (Current goals can be found in the Care Plan section)  Acute Rehab PT Goals Patient Stated Goal: To return to work when he is able to ambulate again. PT Goal Formulation: With patient/family Time For Goal Achievement: 08/28/17 Potential to Achieve Goals: Good    Frequency BID   Barriers to discharge        Co-evaluation               AM-PAC PT "6 Clicks" Daily Activity  Outcome Measure Difficulty turning over in bed (including adjusting bedclothes, sheets and blankets)?: A Little Difficulty moving from lying on back to sitting on the side of the bed? : A Little Difficulty sitting down on and standing up from a chair with arms (e.g., wheelchair, bedside commode, etc,.)?: A Lot Help needed moving to and from a bed to chair (including a wheelchair)?: A Little Help needed walking in hospital room?: A Little Help needed climbing 3-5 steps with a railing? : A Lot 6 Click Score: 16    End of Session Equipment Utilized During Treatment: Gait belt Activity Tolerance: Patient  limited by fatigue Patient left: in bed;with call bell/phone within reach;with bed alarm set;with family/visitor present Nurse Communication: (Pt experienced blood leaking from his IV site during ambulation and RN was notified.) PT Visit Diagnosis: Unsteadiness on feet (R26.81);Muscle weakness (generalized) (M62.81);Pain Pain - Right/Left: Left(Bilateral) Pain - part of body: Ankle and joints of foot;Leg    Time: 1045-1110 PT Time Calculation (min) (ACUTE ONLY): 25 min   Charges:   PT Evaluation $PT Eval Low Complexity: 1 Low PT Treatments $Gait Training: 8-22 mins   PT G Codes:   PT G-Codes **NOT FOR INPATIENT CLASS** Functional Assessment Tool Used: AM-PAC 6 Clicks Basic Mobility    Glenetta Hew, PT, DPT    Glenetta Hew 07/31/2017, 11:21 AM

## 2017-07-31 NOTE — Care Management Note (Signed)
Case Management Note  Patient Details  Name: Roger Wheeler MRN: [id number] Date of Birth: 23-Dec-1960  Subjective/Objective:                  RNCM met with patient and his wife to discuss transition of care under Workers' Comp- [phone] (sheilia colluar?). Message was left for [name] to call RNCM back to discuss DME, HHPT, and medication needs. He has no DME available at home.   Action/Plan:   Referral to [facility] with [facility] for D[facility] and home health if needed as patient agrees with whatever is needed (and Workers' Comp will pay).   Expected Discharge Date:  08/01/17               Expected Discharge Plan:     In-House Referral:     Discharge planning Services  CM Consult  Post Acute Care Choice:  Durable Medical Equipment, Home Health Choice offered to:  Patient, Spouse  DME Arranged:  Walker rolling DME Agency:  [place]:  PT [place]:  [place]  Status of Service:  In process, will continue to follow  If discussed at Long Length of Stay Meetings, dates discussed:    Additional Comments:  Marshell Garfinkel, RN 07/31/2017, 9:40 AM

## 2017-07-31 NOTE — Progress Notes (Signed)
Patient was able to urinate 310 ml , will continue to monitor.

## 2017-07-31 NOTE — Progress Notes (Signed)
Physical Therapy Treatment Patient Details Name: Roger Wheeler MRN: [id number] DOB: [birth year] Today's Date: 07/31/2017    History of Present Illness Pt admitted to hospital s/p ORIF elective surgery for a L tibia/fibula fracture and displaced fracture of medial malleolus of right tibia, initial encounter for closed fracture.  No PMH on file.    PT Comments    Pt was able to progress to negotiation of 6 steps using scooting method, backing up the steps ascending and descending forward with min A from pt's son during scooting and mod A for transfer from STS prior to and after negotiation.  Pt provided education to pt's wife and son concerning monitoring L LE for WB status during stair negotiation.  Pt was provided with recliner assistance to the stairway.  PT also introduced knee scooter to pt and demonstrated proper use, asking the pt to repeat back which LE should be used for propulsion.  PT issued HEP and reviewed seated exercises with pt, noting which exercises are currently appropriate to do and which he should progress to later.  Pt expressed understanding.  Pt will continue to benefit from skilled PT with focus on strength, functional mobility, HEP, proper use of AD and pain management.  Follow Up Recommendations  Home health PT     Equipment Recommendations  Rolling walker with 5" wheels    Recommendations for Other Services       Precautions / Restrictions Precautions Precautions: Fall Restrictions Weight Bearing Restrictions: Yes RLE Weight Bearing: Weight bearing as tolerated LLE Weight Bearing: Non weight bearing    Mobility  Bed Mobility Overal bed mobility: Modified Independent Bed Mobility: Supine to Sit     Supine to sit: HOB elevated;Min assist     General bed mobility comments: Pt able to sit at EOB with min A to bring bilateral LE over EOB.  Transfers Overall transfer level: Needs assistance Equipment used: Rolling walker (2 wheeled) Transfers: Squat Pivot  Transfers Sit to Stand: Min guard   Squat pivot transfers: Min guard     General transfer comment: Pt able to transfer from bed to chair with CGA from PT and min VC's for hand placement and maintenance of WB status.    Ambulation/Gait Ambulation/Gait assistance: (Did not perform.  PT introduced knee scooter and demonstrated appropriate use.  Pt was able to repear back which knee should be propped on the scooter and which LE to propel with.) Ambulation Distance (Feet): 15 Feet Assistive device: Rolling walker (2 wheeled)     Gait velocity interpretation: Below normal speed for age/gender General Gait Details: Pt able to maintain NWB status on L LE and negotiate RW during turns with min VC's for sequencing and 2 person assist.  He reported increased UE fatigue and mild increase in pain of R LE following ambulation.   Stairs Stairs: Yes   Stair Management: Two rails;Seated/boosting Number of Stairs: 6 General stair comments: PT directed pt and pt's son in scooting backward to ascend steps and forward down steps as well as assisting to stand from and lower down onto the stairs.  Pt was able to negotiate 6 steps with very minimal assistance from son and PT discussed the son's role in assisting with more steps at home and taking rest breaks.  PT also advised pt's wife to monitor WB status of L LE during stair negotiation.  PT provided recliner assistance to staircase and pt required min A to transfer from staircase to recliner.  Wheelchair Mobility    Modified Rankin (  Stroke Patients Only)       Balance Overall balance assessment: Needs assistance Sitting-balance support: Single extremity supported;Feet unsupported       Standing balance support: Bilateral upper extremity supported   Standing balance comment: Pt requires hand held assist of chair and hand rails for balance at this time due to WB status.                            Cognition Arousal/Alertness:  Awake/alert Behavior During Therapy: WFL for tasks assessed/performed Overall Cognitive Status: Within Functional Limits for tasks assessed                                        Exercises General Exercises - Lower Extremity Hip ABduction/ADduction: Strengthening;10 reps;Seated(Adduction pillow squeezes performed in recliner.) Other Exercises Other Exercises: Chair pushups x10 B UE Other Exercises: PT reviewed HEP and discussed appropriate timing of exercises.  Pt expressed understanding.    General Comments        Pertinent Vitals/Pain Pain Assessment: 0-10 Pain Score: 5  Pain Intervention(s): Limited activity within patient's tolerance;Monitored during session    Home Living Family/patient expects to be discharged to:: Private residence Living Arrangements: Spouse/significant other Available Help at Discharge: Family Type of Home: Apartment Home Access: Stairs to enter Entrance Stairs-Rails: Left Home Layout: One level        Prior Function Level of Independence: Independent          PT Goals (current goals can now be found in the care plan section) Acute Rehab PT Goals Patient Stated Goal: To return to work when he is able to ambulate again. PT Goal Formulation: With patient/family Time For Goal Achievement: 08/28/17 Potential to Achieve Goals: Good    Frequency    BID      PT Plan Discharge plan needs to be updated    Co-evaluation              AM-PAC PT "6 Clicks" Daily Activity  Outcome Measure  Difficulty turning over in bed (including adjusting bedclothes, sheets and blankets)?: A Little Difficulty moving from lying on back to sitting on the side of the bed? : A Little Difficulty sitting down on and standing up from a chair with arms (e.g., wheelchair, bedside commode, etc,.)?: A Little Help needed moving to and from a bed to chair (including a wheelchair)?: A Little Help needed walking in hospital room?: A Little Help  needed climbing 3-5 steps with a railing? : A Lot 6 Click Score: 17    End of Session Equipment Utilized During Treatment: Gait belt Activity Tolerance: Patient tolerated treatment well Patient left: in chair;with call bell/phone within reach;with chair alarm set;with family/visitor present Nurse Communication: (Pt experienced blood leaking from his IV site during ambulation and RN was notified.) PT Visit Diagnosis: Unsteadiness on feet (R26.81);Muscle weakness (generalized) (M62.81);Pain Pain - Right/Left: Left Pain - part of body: Leg     Time: 1045-1110 PT Time Calculation (min) (ACUTE ONLY): 25 min  Charges:  $Gait Training: 8-22 mins                    G Codes:  Functional Assessment Tool Used: AM-PAC 6 Clicks Basic Mobility    Glenetta HewSarah Lonza Shimabukuro, PT, DPT    Glenetta HewSarah Tarshia Kot 07/31/2017, 2:06 PM

## 2017-07-31 NOTE — NC FL2 (Signed)
[place] MEDICAID FL2 LEVEL OF CARE SCREENING TOOL     IDENTIFICATION  Patient Name: Roger Wheeler Birthdate: 1961/04/14 Sex: male Admission Date (Current Location): 07/30/2017  [place] and [place] Number:  Chiropodist and Address:  [facility], [address] [place], [place] [address]      Provider Number: [id number]  Attending Physician Name and Address:  Christena Flake, MD  Relative Name and Phone Number:       Current Level of Care: Hospital Recommended Level of Care: Skilled Nursing Facility Prior Approval Number:    Date Approved/Denied:   PASRR Number: ([id number])  Discharge Plan: SNF    Current Diagnoses: Patient Active Problem List   Diagnosis Date Noted  . Tibia/fibula fracture, left, closed, initial encounter 07/30/2017    Orientation RESPIRATION BLADDER Height & Weight     Self, Time, Situation, Place  Normal Continent Weight:   Height:     BEHAVIORAL SYMPTOMS/MOOD NEUROLOGICAL BOWEL NUTRITION STATUS      Continent Diet(Heart Healthy/Carb Modified)  AMBULATORY STATUS COMMUNICATION OF NEEDS Skin   Extensive Assist Verbally Surgical wounds(Incision Left and Right Leg)                       Personal Care Assistance Level of Assistance  Bathing, Feeding, Dressing Bathing Assistance: Limited assistance Feeding assistance: Independent Dressing Assistance: Limited assistance     Functional Limitations Info  Sight, Hearing, Speech Sight Info: Adequate Hearing Info: Adequate Speech Info: Adequate    SPECIAL CARE FACTORS FREQUENCY  PT (By licensed PT), OT (By licensed OT)     PT Frequency: (5) OT Frequency: (5)            Contractures      Additional Factors Info  Code Status, Allergies Code Status Info: (Full Code) Allergies Info: (PENICILLINS)           Current Medications (07/31/2017):  This is the current hospital active medication list Current Facility-Administered Medications  Medication  Dose Route Frequency Provider Last Rate Last Dose  . acetaminophen (TYLENOL) tablet 650 mg  650 mg Oral Q4H PRN Poggi, Excell Seltzer, MD       Or  . acetaminophen (TYLENOL) suppository 650 mg  650 mg Rectal Q4H PRN Poggi, Excell Seltzer, MD      . acetaminophen (TYLENOL) tablet 1,000 mg  1,000 mg Oral Q6H Poggi, Excell Seltzer, MD   1,000 mg at 07/31/17 1123  . bisacodyl (DULCOLAX) suppository 10 mg  10 mg Rectal Daily PRN Poggi, Excell Seltzer, MD      . clindamycin (CLEOCIN) IVPB 600 mg  600 mg Intravenous Q6H Poggi, Excell Seltzer, MD 100 mL/hr at 07/31/17 0503 600 mg at 07/31/17 0503  . dextrose 5 % and 0.9 % NaCl with KCl 20 mEq/L infusion   Intravenous Continuous Poggi, Excell Seltzer, MD 100 mL/hr at 07/31/17 0017    . diphenhydrAMINE (BENADRYL) 12.5 MG/5ML elixir 12.5-25 mg  12.5-25 mg Oral Q4H PRN Poggi, Excell Seltzer, MD      . docusate sodium (COLACE) capsule 100 mg  100 mg Oral BID Christena Flake, MD   100 mg at 07/31/17 0944  . enoxaparin (LOVENOX) injection 40 mg  40 mg Subcutaneous Q24H Poggi, Excell Seltzer, MD   40 mg at 07/31/17 0804  . HYDROmorphone (DILAUDID) injection 1-2 mg  1-2 mg Intravenous Q2H PRN Poggi, Excell Seltzer, MD      . ketorolac (TORADOL) 15 MG/ML injection 15 mg  15 mg  Intravenous Q6H Poggi, Excell SeltzerJohn J, MD   15 mg at 07/31/17 0503  . magnesium hydroxide (MILK OF MAGNESIA) suspension 30 mL  30 mL Oral Daily PRN Poggi, Excell SeltzerJohn J, MD      . metoCLOPramide (REGLAN) tablet 5-10 mg  5-10 mg Oral Q8H PRN Poggi, Excell SeltzerJohn J, MD       Or  . metoCLOPramide (REGLAN) injection 5-10 mg  5-10 mg Intravenous Q8H PRN Poggi, Excell SeltzerJohn J, MD      . ondansetron (ZOFRAN) tablet 4 mg  4 mg Oral Q6H PRN Poggi, Excell SeltzerJohn J, MD       Or  . ondansetron (ZOFRAN) injection 4 mg  4 mg Intravenous Q6H PRN Poggi, Excell SeltzerJohn J, MD      . oxyCODONE (Oxy IR/ROXICODONE) immediate release tablet 10 mg  10 mg Oral Q3H PRN Poggi, Excell SeltzerJohn J, MD      . oxyCODONE (Oxy IR/ROXICODONE) immediate release tablet 5 mg  5 mg Oral Q3H PRN Poggi, Excell SeltzerJohn J, MD   5 mg at 07/31/17 0944  . pantoprazole  (PROTONIX) EC tablet 40 mg  40 mg Oral Daily Poggi, Excell SeltzerJohn J, MD   40 mg at 07/31/17 0944  . sodium phosphate (FLEET) 7-19 GM/118ML enema 1 enema  1 enema Rectal Once PRN Poggi, Excell SeltzerJohn J, MD         Discharge Medications: Please see discharge summary for a list of discharge medications.  Relevant Imaging Results:  Relevant Lab Results:   Additional Information (SSN: [id number])  Payton SparkAnanda A Jhan Conery, Student-Social Work

## 2017-07-31 NOTE — Discharge Instructions (Signed)
Diet: As you were doing prior to hospitalization   Shower:  May shower but keep the wounds dry, use an occlusive plastic wrap, NO SOAKING IN TUB.  If the bandage gets wet, change with a clean dry gauze.  Dressing:  Can remove right CAM walker boot to shower, remain in left leg splint and keep splint dry.  Activity:  Increase activity slowly as tolerated, but follow the weight bearing instructions below.  No lifting or driving for 6 weeks.  Weight Bearing:   Weight bearing as tolerated to right leg, non-weightbearing to the left leg.  Blood Clot Prevention: Take 1 325mg  aspirin daily.  To prevent constipation: you may use a stool softener such as -  Colace (over the counter) 100 mg by mouth twice a day  Drink plenty of fluids (prune juice may be helpful) and high fiber foods Miralax (over the counter) for constipation as needed.    Itching:  If you experience itching with your medications, try taking only a single pain pill, or even half a pain pill at a time.  You may take up to 10 pain pills per day, and you can also use benadryl over the counter for itching or also to help with sleep.   Precautions:  If you experience chest pain or shortness of breath - call 911 immediately for transfer to the hospital emergency department!!  If you develop a fever greater that 101 F, purulent drainage from wound, increased redness or drainage from wound, or calf pain-Call [facility]                                              Follow- Up Appointment:  Please call for an appointment to be [facility] in 2 weeks at [facility]

## 2017-07-31 NOTE — Care Management (Signed)
RNCM received call from [place] with [employer] [place] [phone] fax [phone] regarding discharge needs for this patient.  I have request a front-wheeled walker and HHPT. She wishes to speak with someone at [facility] to contract payment for services outside of Workers' Comp.  [facility] this [facility] with [facility].

## 2017-07-31 NOTE — Care Management (Signed)
I have requested invoice for knee scooter rental and purchase price of front-wheeled walker from [facility] [phone] ext [phone]. [facility] has faxed invoice and I have faxed to patient employer Velna HatchetSheila [phone]. Family can pick up both DME tomorrow after items have been paid for.

## 2017-07-31 NOTE — Progress Notes (Signed)
Pt alert resting in room. Wife at bedside. IV infusing. Pt on room air. Complaints of small amounts of pain in legs and PRN medication given.

## 2017-08-01 ENCOUNTER — Inpatient Hospital Stay: Payer: Worker's Compensation

## 2017-08-01 LAB — BASIC METABOLIC PANEL
Anion gap: 7 (ref 5–15)
BUN: 8 mg/dL (ref 6–20)
CO2: 25 mmol/L (ref 22–32)
Calcium: 8 mg/dL — ABNORMAL LOW (ref 8.9–10.3)
Chloride: 106 mmol/L (ref 101–111)
Creatinine, Ser: 0.67 mg/dL (ref 0.61–1.24)
GFR calc Af Amer: >60 mL/min (ref >60)
GFR calc non Af Amer: >60 mL/min (ref >60)
Glucose, Bld: 144 mg/dL — ABNORMAL HIGH (ref 65–99)
Potassium: 3.5 mmol/L (ref 3.5–5.1)
Sodium: 138 mmol/L (ref 135–145)

## 2017-08-01 LAB — URINALYSIS, COMPLETE (UACMP) WITH MICROSCOPIC
Bacteria, UA: NONE SEEN
Bilirubin Urine: NEGATIVE
Glucose, UA: NEGATIVE mg/dL
Ketones, ur: NEGATIVE mg/dL
Leukocytes, UA: NEGATIVE
Nitrite: NEGATIVE
Protein, ur: NEGATIVE mg/dL
Specific Gravity, Urine: 1.012 (ref 1.005–1.030)
Squamous Epithelial / HPF: NONE SEEN
pH: 7 (ref 5.0–8.0)

## 2017-08-01 LAB — CBC
HCT: 39.2 % — ABNORMAL LOW (ref 40.0–52.0)
Hemoglobin: 13.1 g/dL (ref 13.0–18.0)
MCH: 30.4 pg (ref 26.0–34.0)
MCHC: 33.5 g/dL (ref 32.0–36.0)
MCV: 90.9 fL (ref 80.0–100.0)
PLATELETS: 193 10*3/uL (ref 150–440)
RBC: 4.31 MIL/uL — ABNORMAL LOW (ref 4.40–5.90)
RDW: 13 % (ref 11.5–14.5)
WBC: 9.9 10*3/uL (ref 3.8–10.6)

## 2017-08-01 NOTE — Discharge Summary (Signed)
Physician Discharge Summary  Patient ID: Darden Flemister MRN: [id number] DOB/AGE: [birth year] 58 y.o.  Admit date: 07/30/2017 Discharge date: 08/01/2017  Admission Diagnoses:  Surgery, elective [Z41.9] Elective surgery [Z41.9] Tibia/fibula fracture, left, closed, initial encounter [S82.202A, S82.402A] Displaced fracture of medial malleolus of right tibia, initial encounter for closed fracture [S82.51XA]  Discharge Diagnoses: Patient Active Problem List   Diagnosis Date Noted  . Tibia/fibula fracture, left, closed, initial encounter 07/30/2017  1.  Closed displaced distal tibial shaft fracture with proximal fibular shaft fracture, left lower leg. 2.  Closed displaced medial malleolar fracture, right ankle.Marland Kitchen  History reviewed. No pertinent past medical history.   Transfusion: None.   Consultants (if any):   Discharged Condition: Improved  Hospital Course: Caden Fukushima is an 57 y.o. male who was admitted 07/30/2017 with a diagnosis of a closed displaced distal tibial shaft fracture with proximal fibular shaft fracture of the left leg and a closed displaced medial malleolar fracture of the right ankle and went to the operating room on 07/30/2017 and underwent the above named procedures.    Surgeries: Procedure(s): LEFT OPEN REDUCTION INTERNAL FIXATION (ORIF) DISTAL TIBIA FRACTURE RIGHT OPEN INTERNAL FIXATION OF RIGHT MALLEOLUS FRACTURE on 07/30/2017 Patient tolerated the surgery well. Taken to PACU where she was stabilized and then transferred to the orthopedic floor.  Started on Lovenox 40mg  q 24 hrs. Foot pumps applied bilaterally at 80 mm. Heels elevated on bed with rolled towels. No evidence of DVT. Negative Homan. Physical therapy started on day #1 for gait training and transfer. OT started day #1 for ADL and assisted devices.  Patient's IV was removed on POD1 following surgery.  Implants: Biomet ALPS 7-hole distal tibial locking plate and screws for left lower extremity fixation, 40 mm  partially threaded cancellus screws x2 for right ankle fixation.  He was given perioperative antibiotics:  Anti-infectives (From admission, onward)   Start     Dose/Rate Route Frequency Ordered Stop   07/31/17 0000  clindamycin (CLEOCIN) IVPB 600 mg     600 mg 100 mL/hr over 30 Minutes Intravenous Every 6 hours 07/30/17 2318 07/31/17 1218   07/30/17 1600  clindamycin (CLEOCIN) IVPB 600 mg  Status:  Discontinued     600 mg 100 mL/hr over 30 Minutes Intravenous  Once 07/30/17 1232 07/30/17 2259    .  He was given sequential compression devices, early ambulation, and Lovenox for DVT prophylaxis.  He benefited maximally from the hospital stay and there were no complications.    Recent vital signs:  Vitals:   08/01/17 0900 08/01/17 0910  BP: 110/74 110/73  Pulse: 81 81  Resp: 17 18  Temp:  98.3 F (36.8 C)  SpO2: 97% 96%    Recent laboratory studies:  Lab Results  Component Value Date   HGB 13.1 08/01/2017   HGB 13.8 07/31/2017   HGB 16.8 07/30/2017   Lab Results  Component Value Date   WBC 9.9 08/01/2017   PLT 193 08/01/2017   No results found for: INR Lab Results  Component Value Date   NA 138 08/01/2017   K 3.5 08/01/2017   CL 106 08/01/2017   CO2 25 08/01/2017   BUN 8 08/01/2017   CREATININE 0.67 08/01/2017   GLUCOSE 144 (H) 08/01/2017    Discharge Medications:   Allergies as of 08/01/2017      Reactions   Penicillins    Has patient had a PCN reaction causing immediate rash, facial/tongue/throat swelling, SOB or lightheadedness with hypotension: Unknown Has patient had a  PCN reaction causing severe rash involving mucus membranes or skin necrosis: Unknown Has patient had a PCN reaction that required hospitalization: Unknown Has patient had a PCN reaction occurring within the last 10 years: Unknown If all of the above answers are "NO", then may proceed with Cephalosporin use.      Medication List    TAKE these medications   aspirin EC 325 MG tablet Take  1 tablet (325 mg total) by mouth daily.   oxyCODONE 5 MG immediate release tablet Commonly known as:  Oxy IR/ROXICODONE Take 1-2 tablets (5-10 mg total) by mouth every 4 (four) hours as needed for moderate pain.            Durable Medical Equipment  (From admission, onward)        Start     Ordered   08/01/17 0826  For home use only DME Other see comment  Once    Comments:  Bilateral lower extremity fractures; limited weight bearing:  Knee Scooter will help with mobility and ADLs.   08/01/17 0826   08/01/17 0819  For home use only DME Other see comment  Once    Comments:  Bilateral lower extremity fractures; limited weight bearing:  Knee Scooter will help with mobility and ADLs.   08/01/17 0819   07/30/17 2319  DME Walker rolling  Once    Question:  Patient needs a walker to treat with the following condition  Answer:  Tibia/fibula fracture, left, closed, initial encounter   07/30/17 2318   07/30/17 2319  DME 3 n 1  Once     07/30/17 2318   07/30/17 2319  DME Bedside commode  Once    Question:  Patient needs a bedside commode to treat with the following condition  Answer:  Tibia/fibula fracture, left, closed, initial encounter   07/30/17 2318      Diagnostic Studies: Dg Lumbar Spine 2-3 Views  Result Date: 07/30/2017 CLINICAL DATA:  Acute low back pain following injury today. Initial encounter. EXAM: LUMBAR SPINE - 2-3 VIEW COMPARISON:  None. FINDINGS: Normal alignment noted. No acute fracture or subluxation. Minimal multilevel degenerative disc disease noted. No focal bony lesions are identified. IMPRESSION: No acute abnormality. Electronically Signed   By: Harmon PierJeffrey  Hu M.D.   On: 07/30/2017 10:12   Dg Tibia/fibula Left  Result Date: 07/30/2017 CLINICAL DATA:  ORIF of the left tib fib. EXAM: LEFT TIBIA AND FIBULA - 2 VIEW; DG C-ARM 61-120 MIN COMPARISON:  Exams earlier today FINDINGS: Seven images are performed, demonstrating plate and screw fixation of the distal tibia.  IMPRESSION: ORIF of the distal tibia. Electronically Signed   By: Norva PavlovElizabeth  Brown M.D.   On: 07/30/2017 20:28   Dg Tibia/fibula Left  Result Date: 07/30/2017 CLINICAL DATA:  Acute left lower leg pain following injury today. Initial encounter. EXAM: LEFT TIBIA AND FIBULA - 2 VIEW COMPARISON:  None. FINDINGS: A comminuted proximal fibular diaphyseal fracture is noted with 1 cm lateral/anterior displacement. A comminuted distal tibial diaphyseal fracture with 1 cm dorsal displacement is noted. A posterior malleolar tibial fracture is identified and appears nondisplaced. There is no evidence of subluxation or dislocation. IMPRESSION: 1. Comminuted proximal fibular diaphyseal fracture with 1 cm lateral/anterior displacement. 2. Comminuted distal tibial diaphyseal fracture with 1 cm dorsal displacement and nondisplaced posterior malleolar tibial fracture. Electronically Signed   By: Harmon PierJeffrey  Hu M.D.   On: 07/30/2017 10:08   Dg Tibia/fibula Right  Result Date: 07/30/2017 CLINICAL DATA:  ORIF OF RIGHT TIB/FIB FLUORO TIME=  6 seconds EXAM: RIGHT TIBIA AND FIBULA - 2 VIEW; DG C-ARM 61-120 MIN COMPARISON:  07/30/2017 FINDINGS: Cortical screws traverse the medial malleolus following ORIF of the ankle. There is diffuse soft tissue swelling. IMPRESSION: Status post ORIF of the right ankle. Electronically Signed   By: Norva Pavlov M.D.   On: 07/30/2017 21:15   Dg Tibia/fibula Right  Result Date: 07/30/2017 CLINICAL DATA:  Acute right lower leg pain following injury today. Initial encounter. EXAM: RIGHT TIBIA AND FIBULA - 2 VIEW COMPARISON:  None. FINDINGS: A medial malleolar tibial fracture is noted with minimal distraction. Small bony densities along the lateral distal fibula noted with soft tissue swelling and could represent small fracture fragments. There is no evidence of subluxation or dislocation. IMPRESSION: 1. Medial malleolar tibial fracture. 2. Small bony densities along the lateral distal fibula with  soft tissue swelling-question small fracture fragments. Electronically Signed   By: Harmon Pier M.D.   On: 07/30/2017 10:11   Dg Chest Port 1 View  Result Date: 08/01/2017 CLINICAL DATA:  Fevers EXAM: PORTABLE CHEST 1 VIEW COMPARISON:  None. FINDINGS: The heart size and mediastinal contours are within normal limits. Both lungs are clear. The visualized skeletal structures are unremarkable. IMPRESSION: No active disease. Electronically Signed   By: Alcide Clever M.D.   On: 08/01/2017 08:50   Dg C-arm 1-60 Min  Result Date: 07/30/2017 CLINICAL DATA:  ORIF OF RIGHT TIB/FIB FLUORO TIME= 6 seconds EXAM: RIGHT TIBIA AND FIBULA - 2 VIEW; DG C-ARM 61-120 MIN COMPARISON:  07/30/2017 FINDINGS: Cortical screws traverse the medial malleolus following ORIF of the ankle. There is diffuse soft tissue swelling. IMPRESSION: Status post ORIF of the right ankle. Electronically Signed   By: Norva Pavlov M.D.   On: 07/30/2017 21:15   Dg C-arm 1-60 Min  Result Date: 07/30/2017 CLINICAL DATA:  ORIF of the left tib fib. EXAM: LEFT TIBIA AND FIBULA - 2 VIEW; DG C-ARM 61-120 MIN COMPARISON:  Exams earlier today FINDINGS: Seven images are performed, demonstrating plate and screw fixation of the distal tibia. IMPRESSION: ORIF of the distal tibia. Electronically Signed   By: Norva Pavlov M.D.   On: 07/30/2017 20:28   Disposition:  Plan will be for discharge home today pending progress with PT.  Follow-up Information    Anson [place], PA-C. Schedule an appointment as soon as possible for a visit in 14 day(s).   Specialty:  Physician Assistant Why:  Splint removal, staple removal and x-rays of left ankle and right ankle. Contact information: [address] [place] [address] [phone]          Signed: Meriel Pica PA-C 08/01/2017, 1:09 PM

## 2017-08-01 NOTE — Care Management (Signed)
Knee scooter and rolling walker has been delivered to this room [facility]. Patient's employer, Velna HatchetSheila, paid for walker and 80$ for one month rental of knee scooter.  Per Velna HatchetSheila patient will only need to pay for him medications at discharge and she will reimburse his cost by check.I am checking with Micah NoelLance PA about outpatient PT Rx and if I need need to make appointment for patient.

## 2017-08-01 NOTE — Progress Notes (Signed)
Physical Therapy Treatment Patient Details Name: Roger Wheeler MRN: [id number] DOB: Jan 17, 1961 Today's Date: 08/01/2017    History of Present Illness Pt admitted to hospital s/p ORIF elective surgery for a L tibia/fibula fracture and displaced fracture of medial malleolus of right tibia, initial encounter for closed fracture.  No PMH on file.    PT Comments    Pt is making good progress towards goals with improved gait distance this session using safe technique of RW. Encouraged short distances as UE fatigue quickly. Able to maintain balance and correct WBing status. Good endurance with there-ex. Ideally would benefit from knee scooter for long distances. Reviewed stair training. Will continue to progress as tolerated.   Follow Up Recommendations  Home health PT     Equipment Recommendations  Rolling walker with 5" wheels    Recommendations for Other Services       Precautions / Restrictions Precautions Precautions: Fall Restrictions Weight Bearing Restrictions: Yes RLE Weight Bearing: Weight bearing as tolerated LLE Weight Bearing: Non weight bearing    Mobility  Bed Mobility Overal bed mobility: Modified Independent Bed Mobility: Supine to Sit     Supine to sit: Modified independent (Device/Increase time)     General bed mobility comments: safe technique performed towards R side of bed. Cues for scooting out towards EOB  Transfers Overall transfer level: Needs assistance Equipment used: Rolling walker (2 wheeled) Transfers: Sit to/from Stand Sit to Stand: Min guard         General transfer comment: Able to stand with improved ease this date. multiple transfers performed, due to fatigue needed min assist occasionally. Able to maintain correct WBing status with cues. RW used  Ambulation/Gait Ambulation/Gait assistance: Min Government social research officerguard Ambulation Distance (Feet): 80 Feet Assistive device: Rolling walker (2 wheeled) Gait Pattern/deviations: Step-to pattern     General  Gait Details: Slow gait speed with cga/occasional min assist with RW. Chair follow provided by wife. 2 seated rest breaks required due to UE fatigue. Able to maintain WBing, needed 1 cue. Encouraged short distance at home   Stairs            Wheelchair Mobility    Modified Rankin (Stroke Patients Only)       Balance                                            Cognition Arousal/Alertness: Awake/alert Behavior During Therapy: WFL for tasks assessed/performed Overall Cognitive Status: Within Functional Limits for tasks assessed                                        Exercises Other Exercises Other Exercises: Pt performed supine ther-ex including B UE bicep curls, LE SLRs, quad sets, and hip abd/add. All ther-ex performed x 12 reps with supervision. Reinforced chair push up once in recliner, performed 5 reps.    General Comments        Pertinent Vitals/Pain Pain Assessment: 0-10 Pain Score: 5  Pain Location: L LE Pain Descriptors / Indicators: Operative site guarding Pain Intervention(s): Limited activity within patient's tolerance;Repositioned;RN gave pain meds during session    Home Living                      Prior Function  PT Goals (current goals can now be found in the care plan section) Acute Rehab PT Goals Patient Stated Goal: To return to work when he is able to ambulate again. PT Goal Formulation: With patient/family Time For Goal Achievement: 08/28/17 Potential to Achieve Goals: Good Progress towards PT goals: Progressing toward goals    Frequency    BID      PT Plan Current plan remains appropriate    Co-evaluation              AM-PAC PT "6 Clicks" Daily Activity  Outcome Measure  Difficulty turning over in bed (including adjusting bedclothes, sheets and blankets)?: A Little Difficulty moving from lying on back to sitting on the side of the bed? : A Little Difficulty sitting  down on and standing up from a chair with arms (e.g., wheelchair, bedside commode, etc,.)?: Unable Help needed moving to and from a bed to chair (including a wheelchair)?: A Little Help needed walking in hospital room?: A Little Help needed climbing 3-5 steps with a railing? : A Lot 6 Click Score: 15    End of Session Equipment Utilized During Treatment: Gait belt Activity Tolerance: Patient tolerated treatment well Patient left: in chair;with family/visitor present Nurse Communication: Mobility status PT Visit Diagnosis: Unsteadiness on feet (R26.81);Muscle weakness (generalized) (M62.81);Pain Pain - Right/Left: Left Pain - part of body: Leg     Time: 1610-9604 PT Time Calculation (min) (ACUTE ONLY): 27 min  Charges:  $Gait Training: 8-22 mins $Therapeutic Exercise: 8-22 mins                    G Codes:       Elizabeth Palau, PT, DPT [phone]    Poppi Scantling 08/01/2017, 11:25 AM

## 2017-08-01 NOTE — Care Management (Addendum)
RNCM spoke with Velna HatchetSheila with [employer] [place] [phone]. She will call [facility] and speak with Ricki RodriguezAngie Trotter to settle payment for knee scooter and walker today. Iona HansenJeff Shoup PT [facility] [phone] fax [phone] address [address] [place] [id number] has received Rx to start Outpatient PT with patient on Monday 08/06/2017 at 11AM. Patient has been updated and agrees.

## 2017-08-01 NOTE — Progress Notes (Signed)
Pt ready for d/c home this afternoon per MD. Pt met PT goals, rolling walker and knee scooter delivered to pt's room. Pt has been afebrile today, pain controlled with prn oxycodone, VSS. Discharge instructions and prescriptions reviewed with pt, his wife, and son; all questions answered. Pt assisted to car by RN.   [place], Jerry Caras

## 2017-08-01 NOTE — Progress Notes (Signed)
  Subjective: 2 Days Post-Op Procedure(s) (LRB): LEFT OPEN REDUCTION INTERNAL FIXATION (ORIF) DISTAL TIBIA FRACTURE RIGHT OPEN INTERNAL FIXATION OF RIGHT MALLEOLUS FRACTURE (Bilateral) Patient reports pain as being more severe in the left ankle than in the right ankle this morning. Patient is well, but has had some minor complaints of fever Plan is to go Home after hospital stay. Negative for chest pain and shortness of breath Fever: 100.4 last night. Gastrointestinal:Negative for nausea and vomiting  Objective: Vital signs in last 24 hours: Temp:  [98.3 F (36.8 C)-100.4 F (38 C)] 100.4 F (38 C) (02/20 0512) Pulse Rate:  [73-87] 76 (02/20 0512) Resp:  [19] 19 (02/20 0512) BP: (98-122)/(64-81) 122/81 (02/20 0512) SpO2:  [96 %-97 %] 97 % (02/20 0512)  Intake/Output from previous day:  Intake/Output Summary (Last 24 hours) at 08/01/2017 0754 Last data filed at 08/01/2017 0523 Gross per 24 hour  Intake 2490 ml  Output 2775 ml  Net -285 ml    Intake/Output this shift: No intake/output data recorded.  Labs: Recent Labs    07/30/17 1005 07/31/17 0314 08/01/17 0413  HGB 16.8 13.8 13.1   Recent Labs    07/31/17 0314 08/01/17 0413  WBC 10.2 9.9  RBC 4.48 4.31*  HCT 40.5 39.2*  PLT 214 193   Recent Labs    07/31/17 0314 08/01/17 0413  NA 136 138  K 3.5 3.5  CL 103 106  CO2 25 25  BUN 16 8  CREATININE 0.69 0.67  GLUCOSE 124* 144*  CALCIUM 7.8* 8.0*   No results for input(s): LABPT, INR in the last 72 hours.   EXAM General - Patient is Alert, Appropriate and Oriented Extremity - ABD soft  Cam walker boot in place for the right ankle, no drainage to the right ankle ACE wrap. Intact to light touch to the right foot, dorsal and volar aspect of the foot, able to flex and extend toes. Intact splint to the left ankle, intact to light touch, able to flex and extend toes on command. Dressing/Incision - Intact dressings to bilateral lower extremities without  drainage. Motor Function - intact, moving foot and toes well on exam.   History reviewed. No pertinent past medical history.  Assessment/Plan: 2 Days Post-Op Procedure(s) (LRB): LEFT OPEN REDUCTION INTERNAL FIXATION (ORIF) DISTAL TIBIA FRACTURE RIGHT OPEN INTERNAL FIXATION OF RIGHT MALLEOLUS FRACTURE (Bilateral) Active Problems:   Tibia/fibula fracture, left, closed, initial encounter  There is no height or weight on file to calculate BMI. Advance diet Up with therapy   CBC ordered for today.  Fevers last night, 100.4. Encouraged incentive spirometer, will obtain UA and CXR today. Up with therapy today.  DVT Prophylaxis - Lovenox, Foot Pumps and TED hose Weight-Bearing as tolerated to right leg, non-weightbearing to the left leg.  Roger BatmanJ. Lance Aubreyanna Dorrough, PA-C [facility] 08/01/2017, 7:54 AM

## 2017-08-02 LAB — URINE CULTURE: CULTURE: NO GROWTH

## 2017-08-08 ENCOUNTER — Telehealth: Payer: Self-pay | Admitting: Licensed Clinical Social Worker

## 2017-08-08 NOTE — Telephone Encounter (Signed)
Clinical Child psychotherapistocial Worker (CSW) contacted patient via telephone because he answered Yes to lost of interest in things and feeling sad and hopeless. Patient answered the phone call and stated that he feels fine and is not depressed. Patient understands limited English and gave the phone to his son Roger Wheeler. Per son patient is doing well [facility]. Son reported that patient is improving and and can go to the bathroom by himself. Son reported that patient is [facility] [place], [place]. Son reported that patient is not depressed or having thoughts of hurting himself. No further follow up is needed.   Baker Hughes IncorporatedBailey Welton Bord, LCSW [phone]

## 2018-10-01 IMAGING — DX DG CHEST 1V PORT
1 series · 1 of 1 positions shown · non-contrast
Comparison: None.

CLINICAL DATA: Fevers

EXAM:
PORTABLE CHEST 1 VIEW

[chest ap]
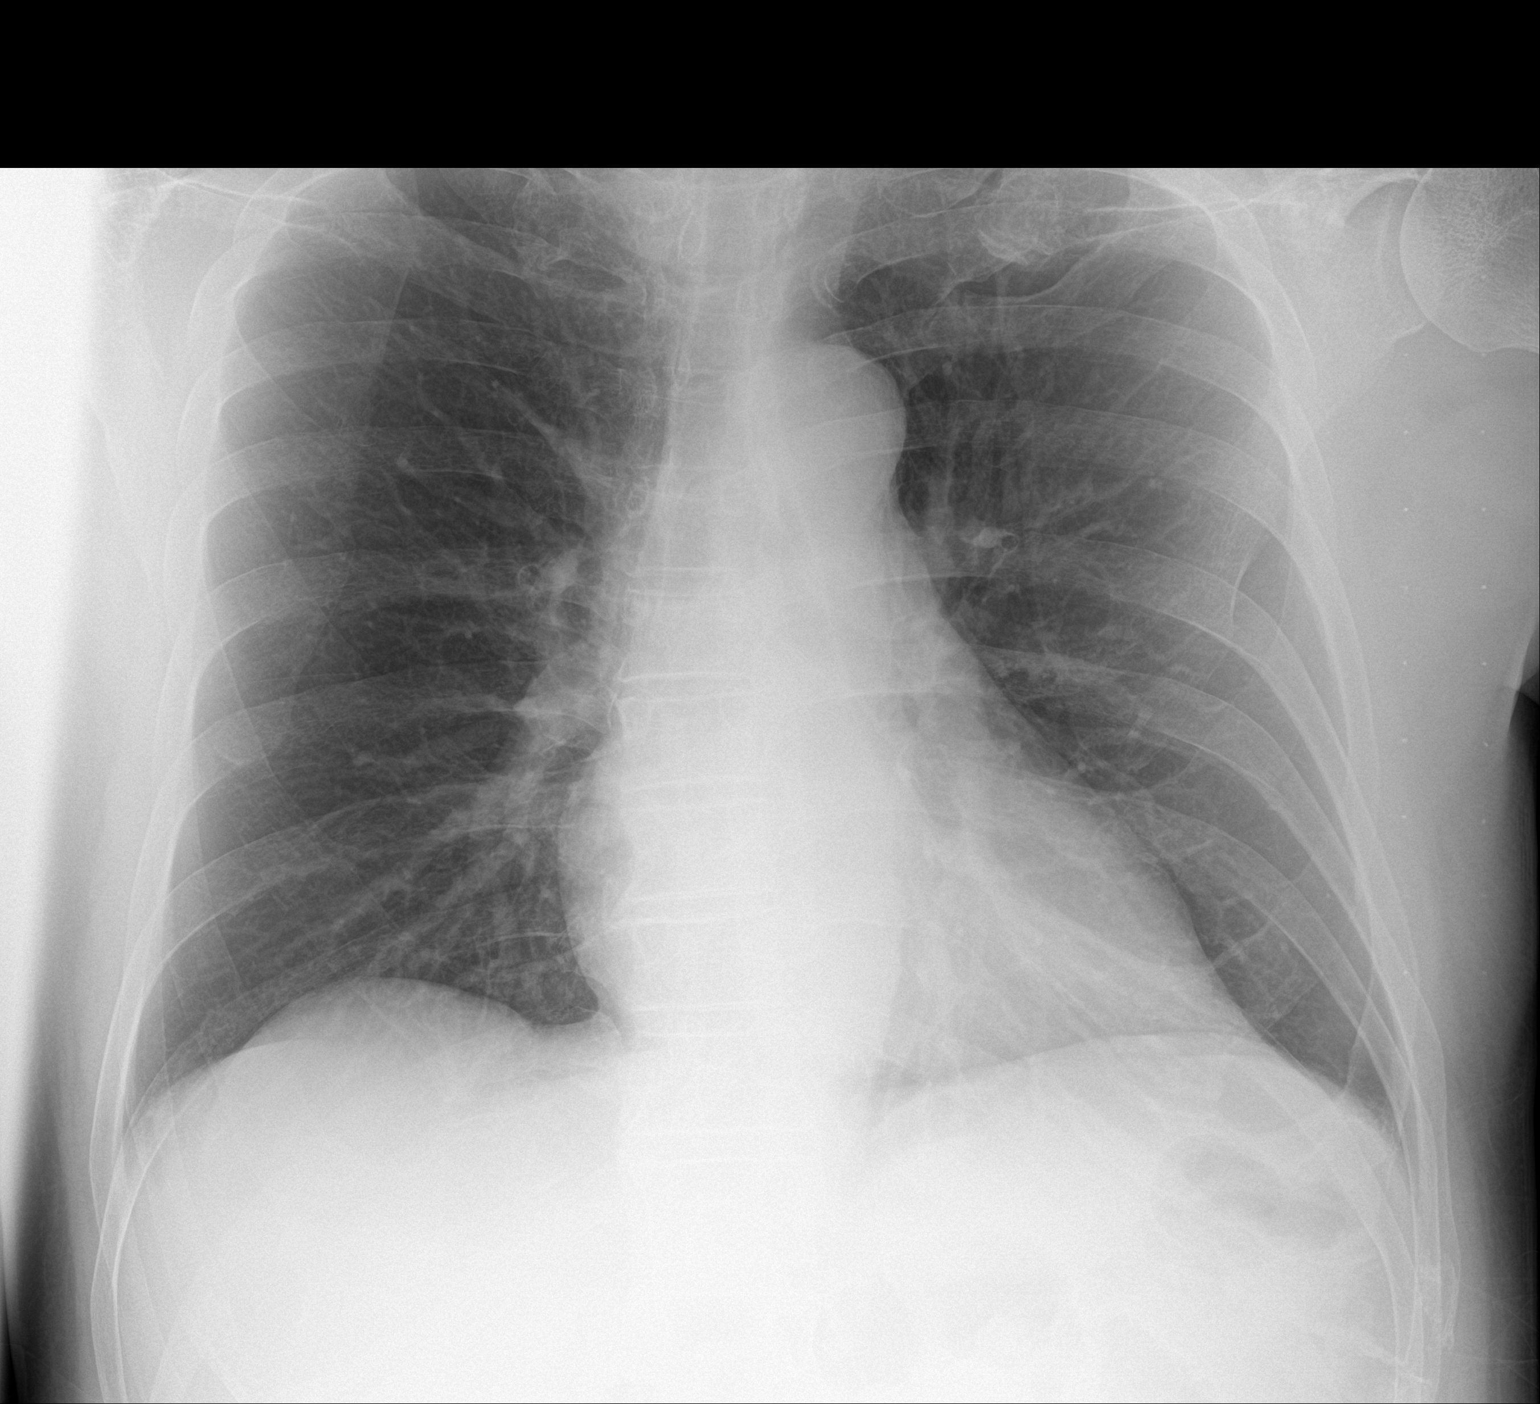

[1 of 1 positions shown; findings below may reference images not displayed]

FINDINGS: The heart size and mediastinal contours are within normal limits.
Both lungs are clear. The visualized skeletal structures are
unremarkable.
IMPRESSION: No active disease.

## 2019-02-07 ENCOUNTER — Other Ambulatory Visit: Payer: Self-pay

## 2019-02-07 ENCOUNTER — Encounter
Admission: RE | Admit: 2019-02-07 | Discharge: 2019-02-07 | Disposition: A | Discharge status: Home / Self Care | Payer: Self-pay | Source: Ambulatory Visit | Attending: Surgery | Admitting: Surgery

## 2019-02-07 DIAGNOSIS — S8251XD Displaced fracture of medial malleolus of right tibia, subsequent encounter for closed fracture with routine healing: Secondary | ICD-10-CM | POA: Insufficient documentation

## 2019-02-07 DIAGNOSIS — X58XXXD Exposure to other specified factors, subsequent encounter: Secondary | ICD-10-CM | POA: Insufficient documentation

## 2019-02-07 DIAGNOSIS — Z01818 Encounter for other preprocedural examination: Secondary | ICD-10-CM | POA: Insufficient documentation

## 2019-02-07 DIAGNOSIS — S82402D Unspecified fracture of shaft of left fibula, subsequent encounter for closed fracture with routine healing: Secondary | ICD-10-CM | POA: Insufficient documentation

## 2019-02-07 DIAGNOSIS — S82202D Unspecified fracture of shaft of left tibia, subsequent encounter for closed fracture with routine healing: Secondary | ICD-10-CM | POA: Insufficient documentation

## 2019-02-07 HISTORY — DX: Personal history of urinary calculi: Z87.442

## 2019-02-07 NOTE — Patient Instructions (Signed)
Your procedure is scheduled VO:ZDGUon:Tues 9/8 Report to Day Surgery.  Medical Mall revolving door To find out your arrival time please call [phone] between 1PM - 3PM on Mon. 9/1  Remember: Instructions that are not followed completely may result in serious medical risk,  up to and including death, or upon the discretion of your surgeon and anesthesiologist your  surgery may need to be rescheduled.     _X__ 1. Do not eat food after midnight the night before your procedure.                 No gum chewing or hard candies. You may drink clear liquids up to 2 hours                 before you are scheduled to arrive for your surgery- DO not drink clear                 liquids within 2 hours of the start of your surgery.                 Clear Liquids include:  water, apple juice without pulp, clear carbohydrate                 drink such as Clearfast of Gatorade, Black Coffee or Tea (Do not add                 anything to coffee or tea).  __X__2.  On the morning of surgery brush your teeth with toothpaste and water, you                may rinse your mouth with mouthwash if you wish.  Do not swallow any toothpaste of mouthwash.     _X__ 3.  No Alcohol for 24 hours before or after surgery.   ___ 4.  Do Not Smoke or use e-cigarettes For 24 Hours Prior to Your Surgery.                 Do not use any chewable tobacco products for at least 6 hours prior to                 surgery.  ____  5.  Bring all medications with you on the day of surgery if instructed.   __x__  6.  Notify your doctor if there is any change in your medical condition      (cold, fever, infections).     Do not wear jewelry, make-up, hairpins, clips or nail polish. Do not wear lotions, powders, or perfumes. You may wear deodorant. Do not shave 48 hours prior to surgery. Men may shave face and neck. Do not bring valuables to the hospital.    [facility] is not responsible for any belongings or  valuables.  Contacts, dentures or bridgework may not be worn into surgery. Leave your suitcase in the car. After surgery it may be brought to your room. For patients admitted to the hospital, discharge time is determined by your treatment team.   Patients discharged the day of surgery will not be allowed to drive home.   Please read over the following fact sheets that you were given:   _x___ Take these medicines the morning of surgery with A SIP OF WATER:    1. none  2.   3.   4.  5.  6.  ____ Fleet Enema (as directed)   __x__ Use CHG Soap as directed  ____ Use inhalers on  the day of surgery  ____ Stop metformin 2 days prior to surgery    ____ Take 1/2 of usual insulin dose the night before surgery. No insulin the morning          of surgery.   ____ Stop Coumadin/Plavix/aspirin on   __x__ Stop Anti-inflammatories No ibuprofen aleve or aspirin after 9/1   May take tylenol   ___x_ Stop supplements until after surgery.  Ascorbic Acid (VITAMIN C PO)  ____ Bring C-Pap to the hospital.

## 2019-02-14 ENCOUNTER — Other Ambulatory Visit
Admission: RE | Admit: 2019-02-14 | Discharge: 2019-02-14 | Disposition: A | Discharge status: Home / Self Care | Payer: HRSA Program | Source: Ambulatory Visit | Attending: Surgery | Admitting: Surgery

## 2019-02-14 ENCOUNTER — Other Ambulatory Visit: Payer: Self-pay

## 2019-02-14 DIAGNOSIS — Z20828 Contact with and (suspected) exposure to other viral communicable diseases: Secondary | ICD-10-CM | POA: Diagnosis not present

## 2019-02-14 DIAGNOSIS — Z01812 Encounter for preprocedural laboratory examination: Secondary | ICD-10-CM | POA: Insufficient documentation

## 2019-02-14 LAB — SARS CORONAVIRUS 2 (TAT 6-24 HRS): SARS Coronavirus 2: NEGATIVE

## 2019-02-18 ENCOUNTER — Encounter
Admission: RE | Disposition: A | Discharge status: Home / Self Care | Payer: Self-pay | Source: Ambulatory Visit | Attending: Surgery

## 2019-02-18 ENCOUNTER — Ambulatory Visit
Admission: RE | Admit: 2019-02-18 | Discharge: 2019-02-18 | Disposition: A | Discharge status: Home / Self Care | Payer: No Typology Code available for payment source | Source: Ambulatory Visit | Attending: Surgery | Admitting: Surgery

## 2019-02-18 ENCOUNTER — Ambulatory Visit: Payer: No Typology Code available for payment source | Admitting: Certified Registered Nurse Anesthetist

## 2019-02-18 ENCOUNTER — Encounter: Payer: Self-pay | Admitting: *Deleted

## 2019-02-18 DIAGNOSIS — Z472 Encounter for removal of internal fixation device: Secondary | ICD-10-CM | POA: Diagnosis not present

## 2019-02-18 DIAGNOSIS — X58XXXD Exposure to other specified factors, subsequent encounter: Secondary | ICD-10-CM | POA: Diagnosis not present

## 2019-02-18 DIAGNOSIS — Z880 Allergy status to penicillin: Secondary | ICD-10-CM | POA: Insufficient documentation

## 2019-02-18 DIAGNOSIS — S82202D Unspecified fracture of shaft of left tibia, subsequent encounter for closed fracture with routine healing: Secondary | ICD-10-CM | POA: Insufficient documentation

## 2019-02-18 HISTORY — PX: HARDWARE REMOVAL: SHX979

## 2019-02-18 SURGERY — REMOVAL, HARDWARE
Anesthesia: General | Site: Ankle | Laterality: Left

## 2019-02-18 MED ORDER — LIDOCAINE HCL (PF) 2 % IJ SOLN
INTRAMUSCULAR | Status: AC
  Filled 2019-02-18: qty 10

## 2019-02-18 MED ORDER — MIDAZOLAM HCL 2 MG/2ML IJ SOLN
INTRAMUSCULAR | Status: DC | PRN
  Administered 2019-02-18 (×2): 1 mg via INTRAVENOUS

## 2019-02-18 MED ORDER — ONDANSETRON HCL 4 MG/2ML IJ SOLN: 4.0000 mg | Freq: Once | INTRAMUSCULAR | Status: DC | PRN

## 2019-02-18 MED ORDER — PROPOFOL 10 MG/ML IV BOLUS
INTRAVENOUS | Status: AC
  Filled 2019-02-18: qty 20

## 2019-02-18 MED ORDER — ONDANSETRON HCL 4 MG/2ML IJ SOLN
INTRAMUSCULAR | Status: DC | PRN
  Administered 2019-02-18: 4 mg via INTRAVENOUS

## 2019-02-18 MED ORDER — METOCLOPRAMIDE HCL 5 MG/ML IJ SOLN: 5.0000 mg | Freq: Three times a day (TID) | INTRAMUSCULAR | Status: DC | PRN

## 2019-02-18 MED ORDER — FAMOTIDINE 20 MG PO TABS
ORAL_TABLET | ORAL | Status: AC
  Administered 2019-02-18: 20 mg via ORAL
  Filled 2019-02-18: qty 1

## 2019-02-18 MED ORDER — LABETALOL HCL 5 MG/ML IV SOLN
5.0000 mg | INTRAVENOUS | Status: AC | PRN
  Administered 2019-02-18 (×4): 5 mg via INTRAVENOUS
  Filled 2019-02-18 (×4): qty 4

## 2019-02-18 MED ORDER — MIDAZOLAM HCL 2 MG/2ML IJ SOLN
INTRAMUSCULAR | Status: AC
  Filled 2019-02-18: qty 2

## 2019-02-18 MED ORDER — ONDANSETRON HCL 4 MG/2ML IJ SOLN
INTRAMUSCULAR | Status: AC
  Filled 2019-02-18: qty 2

## 2019-02-18 MED ORDER — OXYCODONE HCL 5 MG PO TABS: 5.0000 mg | ORAL_TABLET | ORAL | 0 refills | Status: AC | PRN

## 2019-02-18 MED ORDER — KETAMINE HCL 50 MG/ML IJ SOLN
INTRAMUSCULAR | Status: AC
  Filled 2019-02-18: qty 10

## 2019-02-18 MED ORDER — BUPIVACAINE HCL (PF) 0.5 % IJ SOLN
INTRAMUSCULAR | Status: AC
  Filled 2019-02-18: qty 30

## 2019-02-18 MED ORDER — LIDOCAINE HCL (CARDIAC) PF 100 MG/5ML IV SOSY
PREFILLED_SYRINGE | INTRAVENOUS | Status: DC | PRN
  Administered 2019-02-18: 100 mg via INTRAVENOUS

## 2019-02-18 MED ORDER — POTASSIUM CHLORIDE IN NACL 20-0.9 MEQ/L-% IV SOLN: INTRAVENOUS | Status: DC

## 2019-02-18 MED ORDER — BUPIVACAINE HCL (PF) 0.5 % IJ SOLN
INTRAMUSCULAR | Status: DC | PRN
  Administered 2019-02-18: 15 mL

## 2019-02-18 MED ORDER — KETAMINE HCL 50 MG/ML IJ SOLN
INTRAMUSCULAR | Status: DC | PRN
  Administered 2019-02-18 (×2): 25 mg via INTRAVENOUS

## 2019-02-18 MED ORDER — ONDANSETRON HCL 4 MG PO TABS: 4.0000 mg | ORAL_TABLET | Freq: Four times a day (QID) | ORAL | Status: DC | PRN

## 2019-02-18 MED ORDER — OXYCODONE HCL 5 MG PO TABS: 5.0000 mg | ORAL_TABLET | ORAL | Status: DC | PRN

## 2019-02-18 MED ORDER — CLINDAMYCIN PHOSPHATE 900 MG/50ML IV SOLN
INTRAVENOUS | Status: AC
  Filled 2019-02-18: qty 50

## 2019-02-18 MED ORDER — FAMOTIDINE 20 MG PO TABS
20.0000 mg | ORAL_TABLET | Freq: Once | ORAL | Status: AC
  Administered 2019-02-18: 09:00:00 20 mg via ORAL

## 2019-02-18 MED ORDER — ONDANSETRON HCL 4 MG/2ML IJ SOLN: 4.0000 mg | Freq: Four times a day (QID) | INTRAMUSCULAR | Status: DC | PRN

## 2019-02-18 MED ORDER — GLYCOPYRROLATE 0.2 MG/ML IJ SOLN
INTRAMUSCULAR | Status: DC | PRN
  Administered 2019-02-18: 0.2 mg via INTRAVENOUS

## 2019-02-18 MED ORDER — CLINDAMYCIN PHOSPHATE 900 MG/50ML IV SOLN
900.0000 mg | Freq: Once | INTRAVENOUS | Status: AC
  Administered 2019-02-18: 900 mg via INTRAVENOUS

## 2019-02-18 MED ORDER — FENTANYL CITRATE (PF) 100 MCG/2ML IJ SOLN
25.0000 ug | INTRAMUSCULAR | Status: DC | PRN
  Administered 2019-02-18 (×4): 25 ug via INTRAVENOUS

## 2019-02-18 MED ORDER — FENTANYL CITRATE (PF) 100 MCG/2ML IJ SOLN
INTRAMUSCULAR | Status: AC
  Filled 2019-02-18: qty 2

## 2019-02-18 MED ORDER — SEVOFLURANE IN SOLN
RESPIRATORY_TRACT | Status: AC
  Filled 2019-02-18: qty 250

## 2019-02-18 MED ORDER — KETOROLAC TROMETHAMINE 30 MG/ML IJ SOLN
INTRAMUSCULAR | Status: DC | PRN
  Administered 2019-02-18: 30 mg via INTRAVENOUS

## 2019-02-18 MED ORDER — FENTANYL CITRATE (PF) 100 MCG/2ML IJ SOLN
INTRAMUSCULAR | Status: AC
  Administered 2019-02-18: 25 ug via INTRAVENOUS
  Filled 2019-02-18: qty 2

## 2019-02-18 MED ORDER — FENTANYL CITRATE (PF) 100 MCG/2ML IJ SOLN
INTRAMUSCULAR | Status: DC | PRN
  Administered 2019-02-18 (×2): 25 ug via INTRAVENOUS
  Administered 2019-02-18: 50 ug via INTRAVENOUS

## 2019-02-18 MED ORDER — PROPOFOL 10 MG/ML IV BOLUS
INTRAVENOUS | Status: DC | PRN
  Administered 2019-02-18: 150 mg via INTRAVENOUS

## 2019-02-18 MED ORDER — METOCLOPRAMIDE HCL 10 MG PO TABS: 5.0000 mg | ORAL_TABLET | Freq: Three times a day (TID) | ORAL | Status: DC | PRN

## 2019-02-18 MED ORDER — LACTATED RINGERS IV SOLN
INTRAVENOUS | Status: DC
  Administered 2019-02-18: 09:00:00 via INTRAVENOUS

## 2019-02-18 MED ORDER — LABETALOL HCL 5 MG/ML IV SOLN
INTRAVENOUS | Status: AC
  Administered 2019-02-18: 13:00:00 5 mg via INTRAVENOUS
  Filled 2019-02-18: qty 4

## 2019-02-18 SURGICAL SUPPLY — 34 items
BNDG COHESIVE 4X5 TAN STRL (GAUZE/BANDAGES/DRESSINGS) ×3 IMPLANT
BNDG ELASTIC 4X5.8 VLCR STR LF (GAUZE/BANDAGES/DRESSINGS) ×3 IMPLANT
BNDG ESMARK 6X12 TAN STRL LF (GAUZE/BANDAGES/DRESSINGS) ×2 IMPLANT
CANISTER SUCT 1200ML W/VALVE (MISCELLANEOUS) ×3 IMPLANT
CHLORAPREP W/TINT 26 (MISCELLANEOUS) ×6 IMPLANT
COVER WAND RF STERILE (DRAPES) ×3 IMPLANT
CUFF TOURN SGL QUICK 18X4 (TOURNIQUET CUFF) IMPLANT
CUFF TOURN SGL QUICK 24 (TOURNIQUET CUFF)
CUFF TRNQT CYL 24X4X16.5-23 (TOURNIQUET CUFF) IMPLANT
DRAPE FLUOR MINI C-ARM 54X84 (DRAPES) ×3 IMPLANT
DRAPE INCISE IOBAN 66X45 STRL (DRAPES) ×3 IMPLANT
ELECT CAUTERY BLADE 6.4 (BLADE) ×3 IMPLANT
ELECT REM PT RETURN 9FT ADLT (ELECTROSURGICAL) ×3
ELECTRODE REM PT RTRN 9FT ADLT (ELECTROSURGICAL) ×1 IMPLANT
GAUZE SPONGE 4X4 12PLY STRL (GAUZE/BANDAGES/DRESSINGS) ×3 IMPLANT
GAUZE XEROFORM 1X8 LF (GAUZE/BANDAGES/DRESSINGS) ×3 IMPLANT
GLOVE BIO SURGEON STRL SZ8 (GLOVE) ×6 IMPLANT
GLOVE INDICATOR 8.0 STRL GRN (GLOVE) ×3 IMPLANT
GOWN STRL REUS W/ TWL LRG LVL3 (GOWN DISPOSABLE) ×2 IMPLANT
GOWN STRL REUS W/ TWL XL LVL3 (GOWN DISPOSABLE) ×1 IMPLANT
GOWN STRL REUS W/TWL LRG LVL3 (GOWN DISPOSABLE) ×4
GOWN STRL REUS W/TWL XL LVL3 (GOWN DISPOSABLE) ×2
KIT TURNOVER KIT A (KITS) ×3 IMPLANT
NDL FILTER BLUNT 18X1 1/2 (NEEDLE) ×1 IMPLANT
NEEDLE FILTER BLUNT 18X 1/2SAF (NEEDLE) ×2
NEEDLE FILTER BLUNT 18X1 1/2 (NEEDLE) ×1 IMPLANT
NS IRRIG 1000ML POUR BTL (IV SOLUTION) ×3 IMPLANT
PACK EXTREMITY ARMC (MISCELLANEOUS) ×3 IMPLANT
STAPLER SKIN PROX 35W (STAPLE) ×3 IMPLANT
STOCKINETTE M/LG 89821 (MISCELLANEOUS) ×3 IMPLANT
SUT PROLENE 4 0 PS 2 18 (SUTURE) ×6 IMPLANT
SUT VIC AB 2-0 SH 27 (SUTURE) ×4
SUT VIC AB 2-0 SH 27XBRD (SUTURE) ×2 IMPLANT
SYR 10ML LL (SYRINGE) ×3 IMPLANT

## 2019-02-18 NOTE — Anesthesia Post-op Follow-up Note (Signed)
Anesthesia QCDR form completed.        

## 2019-02-18 NOTE — Transfer of Care (Signed)
Immediate Anesthesia Transfer of Care Note  Patient: Roger Wheeler  Procedure(s) Performed: REMOVAL OF DISTAL TIBIAL PLATE AND SCREWS (Left Ankle)  Patient Location: PACU  Anesthesia Type:General  Level of Consciousness: awake and alert   Airway & Oxygen Therapy: Patient Spontanous Breathing and Patient connected to nasal cannula oxygen  Post-op Assessment: Report given to RN and Post -op Vital signs reviewed and stable  Post vital signs: Reviewed and stable  Last Vitals:  Vitals Value Taken Time  BP 137/119 02/18/19 1152  Temp    Pulse 94 02/18/19 1152  Resp 13 02/18/19 1152  SpO2 98 % 02/18/19 1152  Vitals shown include unvalidated device data.  Last Pain:  Vitals:   02/18/19 0851  TempSrc: Temporal  PainSc: 0-No pain         Complications: No apparent anesthesia complications

## 2019-02-18 NOTE — H&P (Signed)
Paper H&P to be scanned into permanent record. H&P reviewed and patient re-examined. No changes. 

## 2019-02-18 NOTE — Discharge Instructions (Addendum)
Orthopedic discharge instructions: Keep dressing dry and intact.  May shower after dressing changed on post-op day #4 (Saturday).  Cover staples with Band-Aids after drying off. Apply ice frequently to lower leg. Take ibuprofen 600 mg TID with meals for 7-10 days, then as necessary. Take oxycodone as prescribed when needed.  May supplement with ES Tylenol if necessary. May weight-bear as tolerated so long as in cam walker boot- use crutches as needed. Follow-up in 10-14 days or as scheduled.  AMBULATORY SURGERY  DISCHARGE INSTRUCTIONS   1) The drugs that you were given will stay in your system until tomorrow so for the next 24 hours you should not:  A) Drive an automobile B) Make any legal decisions C) Drink any alcoholic beverage   2) You may resume regular meals tomorrow.  Today it is better to start with liquids and gradually work up to solid foods.  You may eat anything you prefer, but it is better to start with liquids, then soup and crackers, and gradually work up to solid foods.   3) Please notify your doctor immediately if you have any unusual bleeding, trouble breathing, redness and pain at the surgery site, drainage, fever, or pain not relieved by medication.    4) Additional Instructions:        Please contact your physician with any problems or Same Day Surgery at [phone], Monday through Friday 6 am to 4 pm, or [place] at [facility] number at [phone].

## 2019-02-18 NOTE — Anesthesia Preprocedure Evaluation (Signed)
Anesthesia Evaluation  Patient identified by MRN, date of birth, ID band Patient awake    Reviewed: Allergy & Precautions, H&P , NPO status , Patient's Chart, lab work & pertinent test results, reviewed documented beta blocker date and time   Airway Mallampati: II  TM Distance: >3 FB Neck ROM: full    Dental  (+) Teeth Intact   Pulmonary neg pulmonary ROS,    Pulmonary exam normal        Cardiovascular negative cardio ROS Normal cardiovascular exam Rhythm:regular Rate:Normal     Neuro/Psych negative neurological ROS  negative psych ROS   GI/Hepatic negative GI ROS, Neg liver ROS,   Endo/Other  negative endocrine ROS  Renal/GU negative Renal ROS  negative genitourinary   Musculoskeletal   Abdominal   Peds  Hematology negative hematology ROS (+)   Anesthesia Other Findings Past Medical History: No date: History of kidney stones Past Surgical History: No date: APPENDECTOMY 07/30/2017: ORIF TIBIA FRACTURE; Bilateral     Comment:  Procedure: LEFT OPEN REDUCTION INTERNAL FIXATION (ORIF)               DISTAL TIBIA FRACTURE RIGHT OPEN INTERNAL FIXATION OF               RIGHT MALLEOLUS FRACTURE;  Surgeon: Corky Mull, MD;                Location: [facility] ORS;  Service: Orthopedics;  Laterality:               Bilateral; BMI    Body Mass Index: 23.92 kg/m     Reproductive/Obstetrics negative OB ROS                             Anesthesia Physical Anesthesia Plan  ASA: II  Anesthesia Plan: General ETT   Post-op Pain Management:    Induction:   PONV Risk Score and Plan:   Airway Management Planned:   Additional Equipment:   Intra-op Plan:   Post-operative Plan:   Informed Consent: I have reviewed the patients History and Physical, chart, labs and discussed the procedure including the risks, benefits and alternatives for the proposed anesthesia with the patient or authorized  representative who has indicated his/her understanding and acceptance.     Dental Advisory Given  Plan Discussed with: CRNA  Anesthesia Plan Comments:         Anesthesia Quick Evaluation

## 2019-02-18 NOTE — Op Note (Signed)
02/18/2019  12:09 PM  Patient:   Roger Wheeler  Pre-Op Diagnosis:   Painful retained surgical hardware status post ORIF of left distal tibia fracture.  Post-Op Diagnosis:   Same  Procedure:   Hardware removal left distal tibia.  Surgeon:   Pascal Lux, MD  Assistant:   Nicoletta Dress, PA-S  Anesthesia:   General LMA  Findings:   As above.  Complications:   None  Fluids:   500 cc crystalloid  EBL:   5 cc  UOP:   None  TT:   43 minutes at 250 mmHg  Drains:   None  Closure:   Staples  Brief Clinical Note:   The patient is a 58 year old male who is now 1.5 years status post an open reduction and internal fixation of his left distal tibia fracture.  Despite extensive physical therapy, activity modification, medications, and "tincture of time", the patient continues to have pain along the medial aspect of his mid and distal tibia.  Decision examination are consistent with painful retained surgical hardware.  X-rays demonstrate excellent healing of the fracture.  The patient presents at this time for hardware removal from the left distal tibia.  Procedure:   The patient was brought into the operating room and laid in the supine position.  After adequate general laryngeal mask anesthesia was obtained, the patient's left foot and lower leg were prepped with ChloraPrep solution before being draped sterilely.  Preoperative antibiotics were administered.  A timeout was performed to verify the appropriate surgical site before the left lower leg was exsanguinated with an Esmarch and the calf tourniquet inflated to 250 mmHg.    Proximally, each of the 2 short incisions were reopened and carried down through the subcutaneous tissues to expose the plate.  Each of the 3 proximal bicortical screws were identified and removed using the appropriate screwdriver.    Distally, the incision was reopened.  Again the incision was carried down through the subcutaneous tissues to expose the distal portion of the plate  over the medial malleolar region.  Care was taken to avoid injury to the saphenous nerve and vein.  Each of the 5 distal screws were removed using the appropriate screwdrivers.  The plate itself was then loosened from the bone with a key elevator and removed.  The adequacy of fracture healing and hardware removal was assessed using FluoroScan imaging and found to be excellent.  Each of the wounds was copiously irrigated with sterile saline solution before being closed using 2-0 Vicryl interrupted sutures.  The skin was closed using staples.  A total of 15 cc of 0.5% plain Sensorcaine was injected and around the incision sites help with postoperative analgesia before a sterile bulky dressing was applied to the lower leg.  The patient was placed into a cam walker boot before he was awakened, extubated, and returned to the recovery room in satisfactory condition after tolerating the procedure well.

## 2019-02-18 NOTE — Anesthesia Procedure Notes (Signed)
Procedure Name: LMA Insertion Date/Time: 02/18/2019 10:45 AM Performed by: Bernardo Heater, CRNA Pre-anesthesia Checklist: Patient identified, Patient being monitored, Timeout performed, Emergency Drugs available and Suction available Patient Re-evaluated:Patient Re-evaluated prior to induction Oxygen Delivery Method: Circle system utilized Preoxygenation: Pre-oxygenation with 100% oxygen Induction Type: IV induction Ventilation: Mask ventilation without difficulty LMA: LMA inserted LMA Size: 4.5 Tube type: Oral Number of attempts: 1 Placement Confirmation: positive ETCO2 and breath sounds checked- equal and bilateral Tube secured with: Tape Dental Injury: Teeth and Oropharynx as per pre-operative assessment

## 2019-02-20 NOTE — Anesthesia Postprocedure Evaluation (Signed)
Anesthesia Post Note  Patient: Roger Wheeler  Procedure(s) Performed: REMOVAL OF DISTAL TIBIAL PLATE AND SCREWS (Left Ankle)  Patient location during evaluation: PACU Anesthesia Type: General Level of consciousness: awake and alert Pain management: pain level controlled Vital Signs Assessment: post-procedure vital signs reviewed and stable Respiratory status: spontaneous breathing, nonlabored ventilation, respiratory function stable and patient connected to nasal cannula oxygen Cardiovascular status: blood pressure returned to baseline and stable Postop Assessment: no apparent nausea or vomiting Anesthetic complications: no     Last Vitals:  Vitals:   02/18/19 1308 02/18/19 1324  BP: (!) 134/96 (!) 122/92  Pulse: (!) 59 (!) 58  Resp: 14 16  Temp: (!) 36.2 C   SpO2: 98% 97%    Last Pain:  Vitals:   02/19/19 0842  TempSrc:   PainSc: 0-No pain                 Molli Barrows
